# Patient Record
Sex: Female | Born: 1940
Health system: Southern US, Community
[De-identification: ages and names within clinical notes are randomized; demographics above are authoritative.]

## PROBLEM LIST (undated history)

## (undated) DIAGNOSIS — C539 Malignant neoplasm of cervix uteri, unspecified: Secondary | ICD-10-CM

## (undated) DIAGNOSIS — R112 Nausea with vomiting, unspecified: Secondary | ICD-10-CM

## (undated) DIAGNOSIS — I10 Essential (primary) hypertension: Secondary | ICD-10-CM

## (undated) DIAGNOSIS — M1990 Unspecified osteoarthritis, unspecified site: Secondary | ICD-10-CM

## (undated) DIAGNOSIS — K219 Gastro-esophageal reflux disease without esophagitis: Secondary | ICD-10-CM

## (undated) DIAGNOSIS — Z9889 Other specified postprocedural states: Secondary | ICD-10-CM

## (undated) HISTORY — PX: APPENDECTOMY: SHX54

## (undated) HISTORY — PX: CHOLECYSTECTOMY: SHX55

## (undated) HISTORY — PX: TONSILLECTOMY: SUR1361

## (undated) HISTORY — PX: ABDOMINAL HYSTERECTOMY: SHX81

## (undated) HISTORY — PX: CATARACT EXTRACTION, BILATERAL: SHX1313

---

## 2004-12-20 ENCOUNTER — Ambulatory Visit: Payer: Self-pay | Admitting: Family Medicine

## 2005-12-10 ENCOUNTER — Ambulatory Visit: Payer: Self-pay | Admitting: Family Medicine

## 2005-12-24 ENCOUNTER — Ambulatory Visit: Payer: Self-pay | Admitting: Family Medicine

## 2006-01-07 ENCOUNTER — Ambulatory Visit: Payer: Self-pay | Admitting: Family Medicine

## 2006-01-19 ENCOUNTER — Ambulatory Visit: Payer: Self-pay | Admitting: Family Medicine

## 2006-02-10 ENCOUNTER — Ambulatory Visit: Payer: Self-pay | Admitting: Family Medicine

## 2008-04-20 ENCOUNTER — Ambulatory Visit (HOSPITAL_COMMUNITY): Admission: RE | Admit: 2008-04-20 | Discharge: 2008-04-20 | Payer: Self-pay | Admitting: Ophthalmology

## 2008-06-08 ENCOUNTER — Ambulatory Visit (HOSPITAL_COMMUNITY): Admission: RE | Admit: 2008-06-08 | Discharge: 2008-06-08 | Payer: Self-pay | Admitting: Ophthalmology

## 2010-04-04 ENCOUNTER — Ambulatory Visit (HOSPITAL_COMMUNITY): Admission: RE | Admit: 2010-04-04 | Discharge: 2010-04-04 | Payer: Self-pay | Admitting: Gastroenterology

## 2011-02-25 ENCOUNTER — Other Ambulatory Visit: Payer: Self-pay | Admitting: Gastroenterology

## 2011-07-03 LAB — BASIC METABOLIC PANEL
BUN: 9
CO2: 30
Chloride: 107
Creatinine, Ser: 0.62
Glucose, Bld: 94

## 2012-10-29 ENCOUNTER — Other Ambulatory Visit: Payer: Self-pay | Admitting: Gastroenterology

## 2013-02-02 ENCOUNTER — Emergency Department (HOSPITAL_COMMUNITY)
Admission: EM | Admit: 2013-02-02 | Discharge: 2013-02-03 | Disposition: A | Discharge status: Home / Self Care | Payer: Medicare Other | Attending: Emergency Medicine | Admitting: Emergency Medicine

## 2013-02-02 ENCOUNTER — Encounter (HOSPITAL_COMMUNITY): Payer: Self-pay | Admitting: *Deleted

## 2013-02-02 DIAGNOSIS — Z87891 Personal history of nicotine dependence: Secondary | ICD-10-CM | POA: Insufficient documentation

## 2013-02-02 DIAGNOSIS — Z79899 Other long term (current) drug therapy: Secondary | ICD-10-CM | POA: Insufficient documentation

## 2013-02-02 DIAGNOSIS — Z8541 Personal history of malignant neoplasm of cervix uteri: Secondary | ICD-10-CM | POA: Insufficient documentation

## 2013-02-02 DIAGNOSIS — N390 Urinary tract infection, site not specified: Secondary | ICD-10-CM | POA: Insufficient documentation

## 2013-02-02 HISTORY — DX: Malignant neoplasm of cervix uteri, unspecified: C53.9

## 2013-02-02 NOTE — ED Notes (Signed)
Pt to the ED with c/o abdominal and left/right flank pain. Pt denies n/v/d. Pt was dx with a kidney infection from [facility]. Pt states pain has not improved since yesterday morning. Pt is A&Ox4, respirations equal and unlabored, skin warm and dry.

## 2013-02-03 ENCOUNTER — Emergency Department (HOSPITAL_COMMUNITY): Payer: Medicare Other

## 2013-02-03 LAB — URINALYSIS, MICROSCOPIC ONLY
Glucose, UA: NEGATIVE mg/dL
Hgb urine dipstick: NEGATIVE
Ketones, ur: 15 mg/dL — AB
Nitrite: POSITIVE — AB
Protein, ur: NEGATIVE mg/dL
Specific Gravity, Urine: 1.011 (ref 1.005–1.030)
Urobilinogen, UA: 1 mg/dL (ref 0.0–1.0)
pH: 5 (ref 5.0–8.0)

## 2013-02-03 LAB — COMPREHENSIVE METABOLIC PANEL
ALT: 15 U/L (ref 0–35)
AST: 22 U/L (ref 0–37)
Albumin: 3.8 g/dL (ref 3.5–5.2)
Alkaline Phosphatase: 101 U/L (ref 39–117)
BUN: 20 mg/dL (ref 6–23)
CO2: 30 mEq/L (ref 19–32)
Calcium: 9.4 mg/dL (ref 8.4–10.5)
Chloride: 100 mEq/L (ref 96–112)
Creatinine, Ser: 0.99 mg/dL (ref 0.50–1.10)
GFR calc Af Amer: 64 mL/min — ABNORMAL LOW (ref >90)
GFR calc non Af Amer: 56 mL/min — ABNORMAL LOW (ref >90)
Glucose, Bld: 124 mg/dL — ABNORMAL HIGH (ref 70–99)
Potassium: 3.7 mEq/L (ref 3.5–5.1)
Sodium: 139 mEq/L (ref 135–145)
Total Bilirubin: 0.5 mg/dL (ref 0.3–1.2)
Total Protein: 7.2 g/dL (ref 6.0–8.3)

## 2013-02-03 LAB — CBC WITH DIFFERENTIAL/PLATELET
Basophils Absolute: 0 10*3/uL (ref 0.0–0.1)
Basophils Relative: 0 % (ref 0–1)
Eosinophils Absolute: 0.4 10*3/uL (ref 0.0–0.7)
Eosinophils Relative: 2 % (ref 0–5)
HCT: 35.5 % — ABNORMAL LOW (ref 36.0–46.0)
Hemoglobin: 11.5 g/dL — ABNORMAL LOW (ref 12.0–15.0)
Lymphocytes Relative: 5 % — ABNORMAL LOW (ref 12–46)
Lymphs Abs: 0.9 10*3/uL (ref 0.7–4.0)
MCH: 27.9 pg (ref 26.0–34.0)
MCHC: 32.4 g/dL (ref 30.0–36.0)
MCV: 86.2 fL (ref 78.0–100.0)
Monocytes Absolute: 0.8 10*3/uL (ref 0.1–1.0)
Monocytes Relative: 4 % (ref 3–12)
Neutro Abs: 17.5 10*3/uL — ABNORMAL HIGH (ref 1.7–7.7)
Neutrophils Relative %: 89 % — ABNORMAL HIGH (ref 43–77)
Platelets: 185 10*3/uL (ref 150–400)
RBC: 4.12 MIL/uL (ref 3.87–5.11)
RDW: 14.1 % (ref 11.5–15.5)
WBC: 19.7 10*3/uL — ABNORMAL HIGH (ref 4.0–10.5)

## 2013-02-03 LAB — LIPASE, BLOOD: Lipase: 16 U/L (ref 11–59)

## 2013-02-03 MED ORDER — ONDANSETRON HCL 4 MG PO TABS: 4.0000 mg | ORAL_TABLET | Freq: Four times a day (QID) | ORAL | Status: DC

## 2013-02-03 MED ORDER — HYDROCODONE-ACETAMINOPHEN 5-325 MG PO TABS: 1.0000 | ORAL_TABLET | Freq: Four times a day (QID) | ORAL | Status: DC | PRN

## 2013-02-03 MED ORDER — HYDROCODONE-ACETAMINOPHEN 5-325 MG PO TABS: 1.0000 | ORAL_TABLET | Freq: Once | ORAL | Status: DC

## 2013-02-03 MED ORDER — CEPHALEXIN 250 MG PO CAPS: 250.0000 mg | ORAL_CAPSULE | Freq: Four times a day (QID) | ORAL | Status: DC

## 2013-02-03 MED ORDER — SODIUM CHLORIDE 0.9 % IV SOLN
Freq: Once | INTRAVENOUS | Status: AC
  Administered 2013-02-03: 02:00:00 via INTRAVENOUS

## 2013-02-03 MED ORDER — ONDANSETRON HCL 4 MG/2ML IJ SOLN
4.0000 mg | Freq: Once | INTRAMUSCULAR | Status: AC
  Administered 2013-02-03: 4 mg via INTRAVENOUS
  Filled 2013-02-03: qty 2

## 2013-02-03 MED ORDER — HYDROMORPHONE HCL PF 1 MG/ML IJ SOLN
1.0000 mg | Freq: Once | INTRAMUSCULAR | Status: AC
  Administered 2013-02-03: 1 mg via INTRAVENOUS
  Filled 2013-02-03: qty 1

## 2013-02-03 MED ORDER — DEXTROSE 5 % IV SOLN
1.0000 g | Freq: Once | INTRAVENOUS | Status: AC
  Administered 2013-02-03: 1 g via INTRAVENOUS
  Filled 2013-02-03: qty 10

## 2013-02-03 NOTE — ED Provider Notes (Signed)
History     CSN: [id number]  Arrival date & time 02/02/13  2301   First MD Initiated Contact with Patient 02/03/13 0115      Chief Complaint  Patient presents with  . Abdominal Pain  . Flank Pain    (Consider location/radiation/quality/duration/timing/severity/associated sxs/prior treatment) HPI Comments: Patient was seen yesterday in the [facility] for abdominal pain.  She had lab work, and CT scan, which revealed that she had a UTI.  She was discharged him with a rate in Macrobid, and one dose of Diflucan, which she, states she's been taking OCs, increased abdominal pain, which is diffuse and now radiating to her left flank, nausea, without vomiting.  She, states she's having regular bowel movements.  She has taken nothing for pain, or nausea.  She was prescribed, nothing.  Last night on discharge.  She has a history of abdominal hysterectomy and cholecystectomy both done.  A number of years ago.  Denies any other abdominal surgeries.  Denies any recent illnesses  Patient is a 72 y.o. female presenting with abdominal pain and flank pain. The history is provided by the patient.  Abdominal Pain Pain location:  Generalized Pain quality: aching and bloating   Pain radiates to:  L flank Pain severity:  Severe Onset quality:  Gradual Duration:  3 days Timing:  Constant Progression:  Worsening Chronicity:  New Relieved by:  Nothing Ineffective treatments:  None tried Associated symptoms: nausea   Associated symptoms: no chest pain, no chills, no constipation, no cough, no diarrhea, no dysuria, no fever, no shortness of breath, no vaginal bleeding, no vaginal discharge and no vomiting   Flank Pain Associated symptoms include abdominal pain and nausea. Pertinent negatives include no chest pain, chills, coughing, fever, headaches, myalgias, rash, vomiting or weakness.    Past Medical History  Diagnosis Date  . Cervical cancer     Past Surgical History  Procedure Laterality Date   . Abdominal hysterectomy    . Cholecystectomy    . Appendectomy      History reviewed. No pertinent family history.  History  Substance Use Topics  . Smoking status: Former Games developer  . Smokeless tobacco: Never Used  . Alcohol Use: No    OB History   Grav Para Term Preterm Abortions TAB SAB Ect Mult Living                  Review of Systems  Constitutional: Negative for fever and chills.  Respiratory: Negative for cough and shortness of breath.   Cardiovascular: Negative for chest pain and leg swelling.  Gastrointestinal: Positive for nausea and abdominal pain. Negative for vomiting, diarrhea, constipation and abdominal distention.  Genitourinary: Positive for frequency and flank pain. Negative for dysuria, vaginal bleeding, vaginal discharge and pelvic pain.  Musculoskeletal: Negative for myalgias.  Skin: Negative for rash.  Neurological: Negative for dizziness, weakness and headaches.  All other systems reviewed and are negative.    Allergies  Review of patient's allergies indicates no known allergies.  Home Medications   Current Outpatient Rx  Name  Route  Sig  Dispense  Refill  . esomeprazole (NEXIUM) 40 MG capsule   Oral   Take 40 mg by mouth daily before breakfast.         . fluconazole (DIFLUCAN) 150 MG tablet   Oral   Take 150 mg by mouth once.         Marland Kitchen ibuprofen (ADVIL,MOTRIN) 800 MG tablet   Oral   Take 800 mg by  mouth every morning.         . Multiple Vitamin (MULTIVITAMIN WITH MINERALS) TABS   Oral   Take 1 tablet by mouth daily.         . nitrofurantoin, macrocrystal-monohydrate, (MACROBID) 100 MG capsule   Oral   Take 100 mg by mouth 2 (two) times daily.         . phenazopyridine (PYRIDIUM) 200 MG tablet   Oral   Take 200 mg by mouth 3 (three) times daily as needed for pain.         . cephALEXin (KEFLEX) 250 MG capsule   Oral   Take 1 capsule (250 mg total) by mouth 4 (four) times daily.   28 capsule   0   .  HYDROcodone-acetaminophen (NORCO/VICODIN) 5-325 MG per tablet   Oral   Take 1 tablet by mouth every 6 (six) hours as needed for pain.   12 tablet   0   . ondansetron (ZOFRAN) 4 MG tablet   Oral   Take 1 tablet (4 mg total) by mouth every 6 (six) hours.   12 tablet   0     BP 145/54  Pulse 85  Temp(Src) 98.3 F (36.8 C) (Oral)  Resp 16  Ht 5\' 3"  (1.6 m)  Wt 162 lb 3.2 oz (73.573 kg)  BMI 28.74 kg/m2  SpO2 92%  Physical Exam  Nursing note and vitals reviewed. Constitutional: She is oriented to person, place, and time. She appears well-developed and well-nourished.  HENT:  Head: Normocephalic.  Eyes: Pupils are equal, round, and reactive to light.  Neck: Normal range of motion.  Cardiovascular: Normal rate and regular rhythm.   Pulmonary/Chest: Effort normal and breath sounds normal.  Abdominal: Soft. She exhibits no distension. Bowel sounds are decreased. There is no hepatosplenomegaly. There is generalized tenderness. There is no CVA tenderness.  Musculoskeletal: Normal range of motion.  Neurological: She is alert and oriented to person, place, and time.  Skin: Skin is warm and dry. There is pallor.    ED Course  Procedures (including critical care time)  Labs Reviewed  CBC WITH DIFFERENTIAL - Abnormal; Notable for the following:    WBC 19.7 (*)    Hemoglobin 11.5 (*)    HCT 35.5 (*)    Neutrophils Relative 89 (*)    Neutro Abs 17.5 (*)    Lymphocytes Relative 5 (*)    All other components within normal limits  COMPREHENSIVE METABOLIC PANEL - Abnormal; Notable for the following:    Glucose, Bld 124 (*)    GFR calc non Af Amer 56 (*)    GFR calc Af Amer 64 (*)    All other components within normal limits  URINALYSIS, MICROSCOPIC ONLY - Abnormal; Notable for the following:    Color, Urine ORANGE (*)    Bilirubin Urine SMALL (*)    Ketones, ur 15 (*)    Nitrite POSITIVE (*)    Leukocytes, UA MODERATE (*)    All other components within normal limits  LIPASE,  BLOOD  LACTIC ACID, PLASMA   Dg Abd Acute W/chest  02/03/2013  *RADIOLOGY REPORT*  Clinical Data: Upper abdominal pain and nausea for 48 hours.  ACUTE ABDOMEN SERIES (ABDOMEN 2 VIEW & CHEST 1 VIEW)  Comparison: CT abdomen and pelvis 02/01/2013, chest 03/08/2010  Findings: Cardiac enlargement with pulmonary vascular congestion and interstitial changes suggesting mild interstitial edema.  These changes have progressed since the previous study.  No focal consolidation.  No blunting of costophrenic  angles.  Underlying chronic bronchitic changes remain present.  Scattered gas and stool throughout the colon.  No small or large bowel distension.  No free intra-abdominal air.  No abnormal air fluid levels.  No radiopaque stones.  Postoperative changes in the pelvis.  Degenerative changes in the lumbar spine and hips.  IMPRESSION: Cardiac enlargement with pulmonary vascular congestion and interstitial edema.  Nonobstructive bowel gas pattern.   Original Report Authenticated By: Burman Nieves, M.D.      1. UTI (lower urinary tract infection)       MDM    will send for records from [facility] from [facility] reviewed.  White count.  At that time was 10.5.  CT scan showed that she had diverticula, without diverticulitis.  She does have a UTI today her white count is elevated at 19.7 Today.  We'll change her antibiotic from Macrobid to Keflex.  I will also give her hydrocodone for pain control, as well as Zofran for any nausea, or recommend she followup with her primary care physician in 10-14 days for test of cure       Arman Filter, NP 02/03/13 [phone]

## 2013-02-08 NOTE — ED Provider Notes (Signed)
Medical screening examination/treatment/procedure(s) were performed by non-physician practitioner and as supervising physician I was immediately available for consultation/collaboration.  Raeford Razor, MD 02/08/13 [phone]

## 2014-06-20 DIAGNOSIS — M19079 Primary osteoarthritis, unspecified ankle and foot: Secondary | ICD-10-CM | POA: Insufficient documentation

## 2014-09-13 DIAGNOSIS — E782 Mixed hyperlipidemia: Secondary | ICD-10-CM | POA: Insufficient documentation

## 2014-11-16 ENCOUNTER — Other Ambulatory Visit: Payer: Self-pay | Admitting: Orthopedic Surgery

## 2014-11-16 DIAGNOSIS — M1711 Unilateral primary osteoarthritis, right knee: Secondary | ICD-10-CM

## 2014-11-22 ENCOUNTER — Ambulatory Visit
Admission: RE | Admit: 2014-11-22 | Discharge: 2014-11-22 | Disposition: A | Discharge status: Home / Self Care | Payer: Medicare HMO | Source: Ambulatory Visit | Attending: Orthopedic Surgery | Admitting: Orthopedic Surgery

## 2014-11-22 DIAGNOSIS — M1711 Unilateral primary osteoarthritis, right knee: Secondary | ICD-10-CM

## 2015-01-15 ENCOUNTER — Other Ambulatory Visit (HOSPITAL_COMMUNITY): Payer: Self-pay | Admitting: Orthopedic Surgery

## 2015-01-15 DIAGNOSIS — Z4789 Encounter for other orthopedic aftercare: Secondary | ICD-10-CM

## 2015-01-16 ENCOUNTER — Ambulatory Visit (HOSPITAL_COMMUNITY)
Admission: RE | Admit: 2015-01-16 | Discharge: 2015-01-16 | Disposition: A | Discharge status: Home / Self Care | Payer: Medicare HMO | Source: Ambulatory Visit | Attending: Cardiovascular Disease | Admitting: Cardiovascular Disease

## 2015-01-16 DIAGNOSIS — M7989 Other specified soft tissue disorders: Secondary | ICD-10-CM

## 2015-01-16 DIAGNOSIS — M7121 Synovial cyst of popliteal space [Baker], right knee: Secondary | ICD-10-CM | POA: Diagnosis not present

## 2015-01-16 DIAGNOSIS — Z4789 Encounter for other orthopedic aftercare: Secondary | ICD-10-CM

## 2015-01-16 NOTE — Progress Notes (Signed)
Right lower extremity venous duplex completed. No evidence for DVT or SVT. Evidence for a Baker's cyst in the popliteal fossa measuring 2.7 x 1.3 x 4.4 centimeters. [place]

## 2015-01-22 ENCOUNTER — Telehealth (HOSPITAL_COMMUNITY): Payer: Self-pay | Admitting: *Deleted

## 2015-03-30 DIAGNOSIS — L03115 Cellulitis of right lower limb: Secondary | ICD-10-CM | POA: Insufficient documentation

## 2015-03-30 DIAGNOSIS — R600 Localized edema: Secondary | ICD-10-CM | POA: Insufficient documentation

## 2015-07-03 ENCOUNTER — Other Ambulatory Visit: Payer: Self-pay | Admitting: Surgical

## 2015-07-10 NOTE — Patient Instructions (Addendum)
[place]  07/10/2015   Your procedure is scheduled on:   07-25-2015 Wednesday  Enter through [facility] and follow signs to [facility]. Arrive at     0615   AM.  (Limit 1 person with you).  Call this number if you have problems the morning of surgery: [phone]  Or Presurgical Testing [phone].   For Living Will and/or Health Care Power Attorney Forms: please provide copy for your medical record,may bring AM of surgery(Forms should be already notarized -we do not provide this service).(07-11-15 No information preferred today).    Do not eat food/ or drink: After Midnight.      Take these medicines the morning of surgery with A SIP OF WATER-   (DO NOT TAKE ANY DIABETIC MEDS AM OF SURGERY) : Amlodipine. Nexium.   Do not wear jewelry, make-up or nail polish.  Do not wear deodorant, lotions, powders, or perfumes.   Do not shave legs and under arms- 48 hours(2 days) prior to first CHG shower.(Shaving face and neck okay.)  Do not bring valuables to the hospital.(Hospital is not responsible for lost valuables).  Contacts, dentures or removable bridgework, body piercing, hair pins may not be worn into surgery.  Leave suitcase in the car. After surgery it may be brought to your room.  For patients admitted to the hospital, checkout time is 11:00 AM the day of discharge.(Restricted visitors-Any Persons displaying flu-like symptoms or illness).    Patients discharged the day of surgery will not be allowed to drive home. Must have responsible person with you x 24 hours once discharged.  Name and phone number of your driver: spouse Ronalee Belts -[phone] home     Please read over the following fact sheets that you were given:  CHG(Chlorhexidine Gluconate 4% Surgical Soap) use, MRSA Information, Blood Transfusion fact sheet, Incentive Spirometry Instruction.  Remember : Type/Screen "Blue armbands" - may not be removed once applied(would result in being retested  AM of surgery, if removed).         [facility] - Preparing for Surgery Before surgery, you can play an important role.  Because skin is not sterile, your skin needs to be as free of germs as possible.  You can reduce the number of germs on your skin by washing with CHG (chlorahexidine gluconate) soap before surgery.  CHG is an antiseptic cleaner which kills germs and bonds with the skin to continue killing germs even after washing. Please DO NOT use if you have an allergy to CHG or antibacterial soaps.  If your skin becomes reddened/irritated stop using the CHG and inform your nurse when you arrive at Short Stay. Do not shave (including legs and underarms) for at least 48 hours prior to the first CHG shower.  You may shave your face/neck. Please follow these instructions carefully:  1.  Shower with CHG Soap the night before surgery and the  morning of Surgery.  2.  If you choose to wash your hair, wash your hair first as usual with your  normal  shampoo.  3.  After you shampoo, rinse your hair and body thoroughly to remove the  shampoo.                           4.  Use CHG as you would any other liquid soap.  You can apply chg directly  to the skin and wash  Gently with a scrungie or clean washcloth.  5.  Apply the CHG Soap to your body ONLY FROM THE NECK DOWN.   Do not use on face/ open                           Wound or open sores. Avoid contact with eyes, ears mouth and genitals (private parts).                       Wash face,  Genitals (private parts) with your normal soap.             6.  Wash thoroughly, paying special attention to the area where your surgery  will be performed.  7.  Thoroughly rinse your body with warm water from the neck down.  8.  DO NOT shower/wash with your normal soap after using and rinsing off  the CHG Soap.                9.  Pat yourself dry with a clean towel.            10.  Wear clean pajamas.            11.  Place clean sheets on your  bed the night of your first shower and do not  sleep with pets. Day of Surgery : Do not apply any lotions/deodorants the morning of surgery.  Please wear clean clothes to the hospital/surgery center.  FAILURE TO FOLLOW THESE INSTRUCTIONS MAY RESULT IN THE CANCELLATION OF YOUR SURGERY PATIENT SIGNATURE_________________________________  NURSE SIGNATURE__________________________________  ________________________________________________________________________   Adam Phenix  An incentive spirometer is a tool that can help keep your lungs clear and active. This tool measures how well you are filling your lungs with each breath. Taking long deep breaths may help reverse or decrease the chance of developing breathing (pulmonary) problems (especially infection) following:  A long period of time when you are unable to move or be active. BEFORE THE PROCEDURE   If the spirometer includes an indicator to show your best effort, your nurse or respiratory therapist will set it to a desired goal.  If possible, sit up straight or lean slightly forward. Try not to slouch.  Hold the incentive spirometer in an upright position. INSTRUCTIONS FOR USE   Sit on the edge of your bed if possible, or sit up as far as you can in bed or on a chair.  Hold the incentive spirometer in an upright position.  Breathe out normally.  Place the mouthpiece in your mouth and seal your lips tightly around it.  Breathe in slowly and as deeply as possible, raising the piston or the ball toward the top of the column.  Hold your breath for 3-5 seconds or for as long as possible. Allow the piston or ball to fall to the bottom of the column.  Remove the mouthpiece from your mouth and breathe out normally.  Rest for a few seconds and repeat Steps 1 through 7 at least 10 times every 1-2 hours when you are awake. Take your time and take a few normal breaths between deep breaths.  The spirometer may include an  indicator to show your best effort. Use the indicator as a goal to work toward during each repetition.  After each set of 10 deep breaths, practice coughing to be sure your lungs are clear. If you have an incision (the cut made at the time of  surgery), support your incision when coughing by placing a pillow or rolled up towels firmly against it. Once you are able to get out of bed, walk around indoors and cough well. You may stop using the incentive spirometer when instructed by your caregiver.  RISKS AND COMPLICATIONS  Take your time so you do not get dizzy or light-headed.  If you are in pain, you may need to take or ask for pain medication before doing incentive spirometry. It is harder to take a deep breath if you are having pain. AFTER USE  Rest and breathe slowly and easily.  It can be helpful to keep track of a log of your progress. Your caregiver can provide you with a simple table to help with this. If you are using the spirometer at home, follow these instructions: [place] IF:   You are having difficultly using the spirometer.  You have trouble using the spirometer as often as instructed.  Your pain medication is not giving enough relief while using the spirometer.  You develop fever of 100.5 F (38.1 C) or higher. SEEK IMMEDIATE MEDICAL CARE IF:   You cough up bloody sputum that had not been present before.  You develop fever of 102 F (38.9 C) or greater.  You develop worsening pain at or near the incision site. MAKE SURE YOU:   Understand these instructions.  Will watch your condition.  Will get help right away if you are not doing well or get worse. Document Released: 02/02/2007 Document Revised: 12/15/2011 Document Reviewed: 04/05/2007 Exit[employer] Patient Information 2014 [employer], [place].   ________________________________________________________________________  WHAT IS A BLOOD TRANSFUSION? Blood Transfusion Information  A transfusion is the  replacement of blood or some of its parts. Blood is made up of multiple cells which provide different functions.  Red blood cells carry oxygen and are used for blood loss replacement.  White blood cells fight against infection.  Platelets control bleeding.  Plasma helps clot blood.  Other blood products are available for specialized needs, such as hemophilia or other clotting disorders. BEFORE THE TRANSFUSION  Who gives blood for transfusions?   Healthy volunteers who are fully evaluated to make sure their blood is safe. This is blood bank blood. Transfusion therapy is the safest it has ever been in the practice of medicine. Before blood is taken from a donor, a complete history is taken to make sure that person has no history of diseases nor engages in risky social behavior (examples are intravenous drug use or sexual activity with multiple partners). The donor's travel history is screened to minimize risk of transmitting infections, such as malaria. The donated blood is tested for signs of infectious diseases, such as HIV and hepatitis. The blood is then tested to be sure it is compatible with you in order to minimize the chance of a transfusion reaction. If you or a relative donates blood, this is often done in anticipation of surgery and is not appropriate for emergency situations. It takes many days to process the donated blood. RISKS AND COMPLICATIONS Although transfusion therapy is very safe and saves many lives, the main dangers of transfusion include:   Getting an infectious disease.  Developing a transfusion reaction. This is an allergic reaction to something in the blood you were given. Every precaution is taken to prevent this. The decision to have a blood transfusion has been considered carefully by your caregiver before blood is given. Blood is not given unless the benefits outweigh the risks. AFTER THE TRANSFUSION  Right after receiving a blood transfusion, you will usually  feel much better and more energetic. This is especially true if your red blood cells have gotten low (anemic). The transfusion raises the level of the red blood cells which carry oxygen, and this usually causes an energy increase.  The nurse administering the transfusion will monitor you carefully for complications. HOME CARE INSTRUCTIONS  No special instructions are needed after a transfusion. You may find your energy is better. Speak with your caregiver about any limitations on activity for underlying diseases you may have. SEEK MEDICAL CARE IF:   Your condition is not improving after your transfusion.  You develop redness or irritation at the intravenous (IV) site. SEEK IMMEDIATE MEDICAL CARE IF:  Any of the following symptoms occur over the next 12 hours:  Shaking chills.  You have a temperature by mouth above 102 F (38.9 C), not controlled by medicine.  Chest, back, or muscle pain.  People around you feel you are not acting correctly or are confused.  Shortness of breath or difficulty breathing.  Dizziness and fainting.  You get a rash or develop hives.  You have a decrease in urine output.  Your urine turns a dark color or changes to pink, red, or brown. Any of the following symptoms occur over the next 10 days:  You have a temperature by mouth above 102 F (38.9 C), not controlled by medicine.  Shortness of breath.  Weakness after normal activity.  The white part of the eye turns yellow (jaundice).  You have a decrease in the amount of urine or are urinating less often.  Your urine turns a dark color or changes to pink, red, or brown. Document Released: 09/19/2000 Document Revised: 12/15/2011 Document Reviewed: 05/08/2008 [facility] Patient Information 2014 [place], [place].  _______________________________________________________________________

## 2015-07-11 ENCOUNTER — Ambulatory Visit (HOSPITAL_COMMUNITY)
Admission: RE | Admit: 2015-07-11 | Discharge: 2015-07-11 | Disposition: A | Discharge status: Home / Self Care | Payer: Medicare HMO | Source: Ambulatory Visit | Attending: Surgical | Admitting: Surgical

## 2015-07-11 ENCOUNTER — Encounter (HOSPITAL_COMMUNITY): Payer: Self-pay

## 2015-07-11 ENCOUNTER — Encounter (HOSPITAL_COMMUNITY)
Admission: RE | Admit: 2015-07-11 | Discharge: 2015-07-11 | Disposition: A | Discharge status: Home / Self Care | Payer: Medicare HMO | Source: Ambulatory Visit | Attending: Orthopedic Surgery | Admitting: Orthopedic Surgery

## 2015-07-11 DIAGNOSIS — Z87891 Personal history of nicotine dependence: Secondary | ICD-10-CM | POA: Insufficient documentation

## 2015-07-11 DIAGNOSIS — M179 Osteoarthritis of knee, unspecified: Secondary | ICD-10-CM | POA: Diagnosis not present

## 2015-07-11 DIAGNOSIS — Z01818 Encounter for other preprocedural examination: Secondary | ICD-10-CM

## 2015-07-11 DIAGNOSIS — Z01812 Encounter for preprocedural laboratory examination: Secondary | ICD-10-CM | POA: Diagnosis not present

## 2015-07-11 DIAGNOSIS — M5134 Other intervertebral disc degeneration, thoracic region: Secondary | ICD-10-CM | POA: Diagnosis not present

## 2015-07-11 HISTORY — DX: Gastro-esophageal reflux disease without esophagitis: K21.9

## 2015-07-11 HISTORY — DX: Nausea with vomiting, unspecified: R11.2

## 2015-07-11 HISTORY — DX: Essential (primary) hypertension: I10

## 2015-07-11 HISTORY — DX: Unspecified osteoarthritis, unspecified site: M19.90

## 2015-07-11 HISTORY — DX: Nausea with vomiting, unspecified: Z98.890

## 2015-07-11 LAB — CBC WITH DIFFERENTIAL/PLATELET
Basophils Absolute: 0 10*3/uL (ref 0.0–0.1)
Basophils Relative: 0 %
Eosinophils Absolute: 0.2 10*3/uL (ref 0.0–0.7)
Eosinophils Relative: 3 %
HCT: 35 % — ABNORMAL LOW (ref 36.0–46.0)
Hemoglobin: 11.3 g/dL — ABNORMAL LOW (ref 12.0–15.0)
Lymphocytes Relative: 23 %
Lymphs Abs: 1.7 10*3/uL (ref 0.7–4.0)
MCH: 28.1 pg (ref 26.0–34.0)
MCHC: 32.3 g/dL (ref 30.0–36.0)
MCV: 87.1 fL (ref 78.0–100.0)
Monocytes Absolute: 0.5 10*3/uL (ref 0.1–1.0)
Monocytes Relative: 7 %
Neutro Abs: 5 10*3/uL (ref 1.7–7.7)
Neutrophils Relative %: 67 %
Platelets: 227 10*3/uL (ref 150–400)
RBC: 4.02 MIL/uL (ref 3.87–5.11)
RDW: 13.9 % (ref 11.5–15.5)
WBC: 7.3 10*3/uL (ref 4.0–10.5)

## 2015-07-11 LAB — COMPREHENSIVE METABOLIC PANEL
ALT: 20 U/L (ref 14–54)
AST: 27 U/L (ref 15–41)
Albumin: 4.2 g/dL (ref 3.5–5.0)
Alkaline Phosphatase: 95 U/L (ref 38–126)
Anion gap: 7 (ref 5–15)
BUN: 19 mg/dL (ref 6–20)
CO2: 28 mmol/L (ref 22–32)
Calcium: 9.4 mg/dL (ref 8.9–10.3)
Chloride: 106 mmol/L (ref 101–111)
Creatinine, Ser: 0.69 mg/dL (ref 0.44–1.00)
GFR calc Af Amer: >60 mL/min (ref >60)
GFR calc non Af Amer: >60 mL/min (ref >60)
Glucose, Bld: 100 mg/dL — ABNORMAL HIGH (ref 65–99)
Potassium: 3.9 mmol/L (ref 3.5–5.1)
Sodium: 141 mmol/L (ref 135–145)
Total Bilirubin: 0.2 mg/dL — ABNORMAL LOW (ref 0.3–1.2)
Total Protein: 7.5 g/dL (ref 6.5–8.1)

## 2015-07-11 LAB — URINALYSIS, ROUTINE W REFLEX MICROSCOPIC
Bilirubin Urine: NEGATIVE
Glucose, UA: NEGATIVE mg/dL
Hgb urine dipstick: NEGATIVE
Ketones, ur: NEGATIVE mg/dL
Leukocytes, UA: NEGATIVE
Nitrite: NEGATIVE
Protein, ur: NEGATIVE mg/dL
Specific Gravity, Urine: 1.008 (ref 1.005–1.030)
Urobilinogen, UA: 0.2 mg/dL (ref 0.0–1.0)
pH: 6 (ref 5.0–8.0)

## 2015-07-11 LAB — PROTIME-INR
INR: 1.05 (ref 0.00–1.49)
Prothrombin Time: 13.9 seconds (ref 11.6–15.2)

## 2015-07-11 LAB — SURGICAL PCR SCREEN
MRSA, PCR: NEGATIVE
Staphylococcus aureus: POSITIVE — AB

## 2015-07-11 IMAGING — CR DG CHEST 2V
2 series · 2 of 2 positions shown · non-contrast
Comparison: None.

CLINICAL DATA: Preoperative examination prior to knee arthroplasty,
no cardiopulmonary history, discontinued smoking five years ago.

EXAM:
CHEST  2 VIEW

[w chest pa]
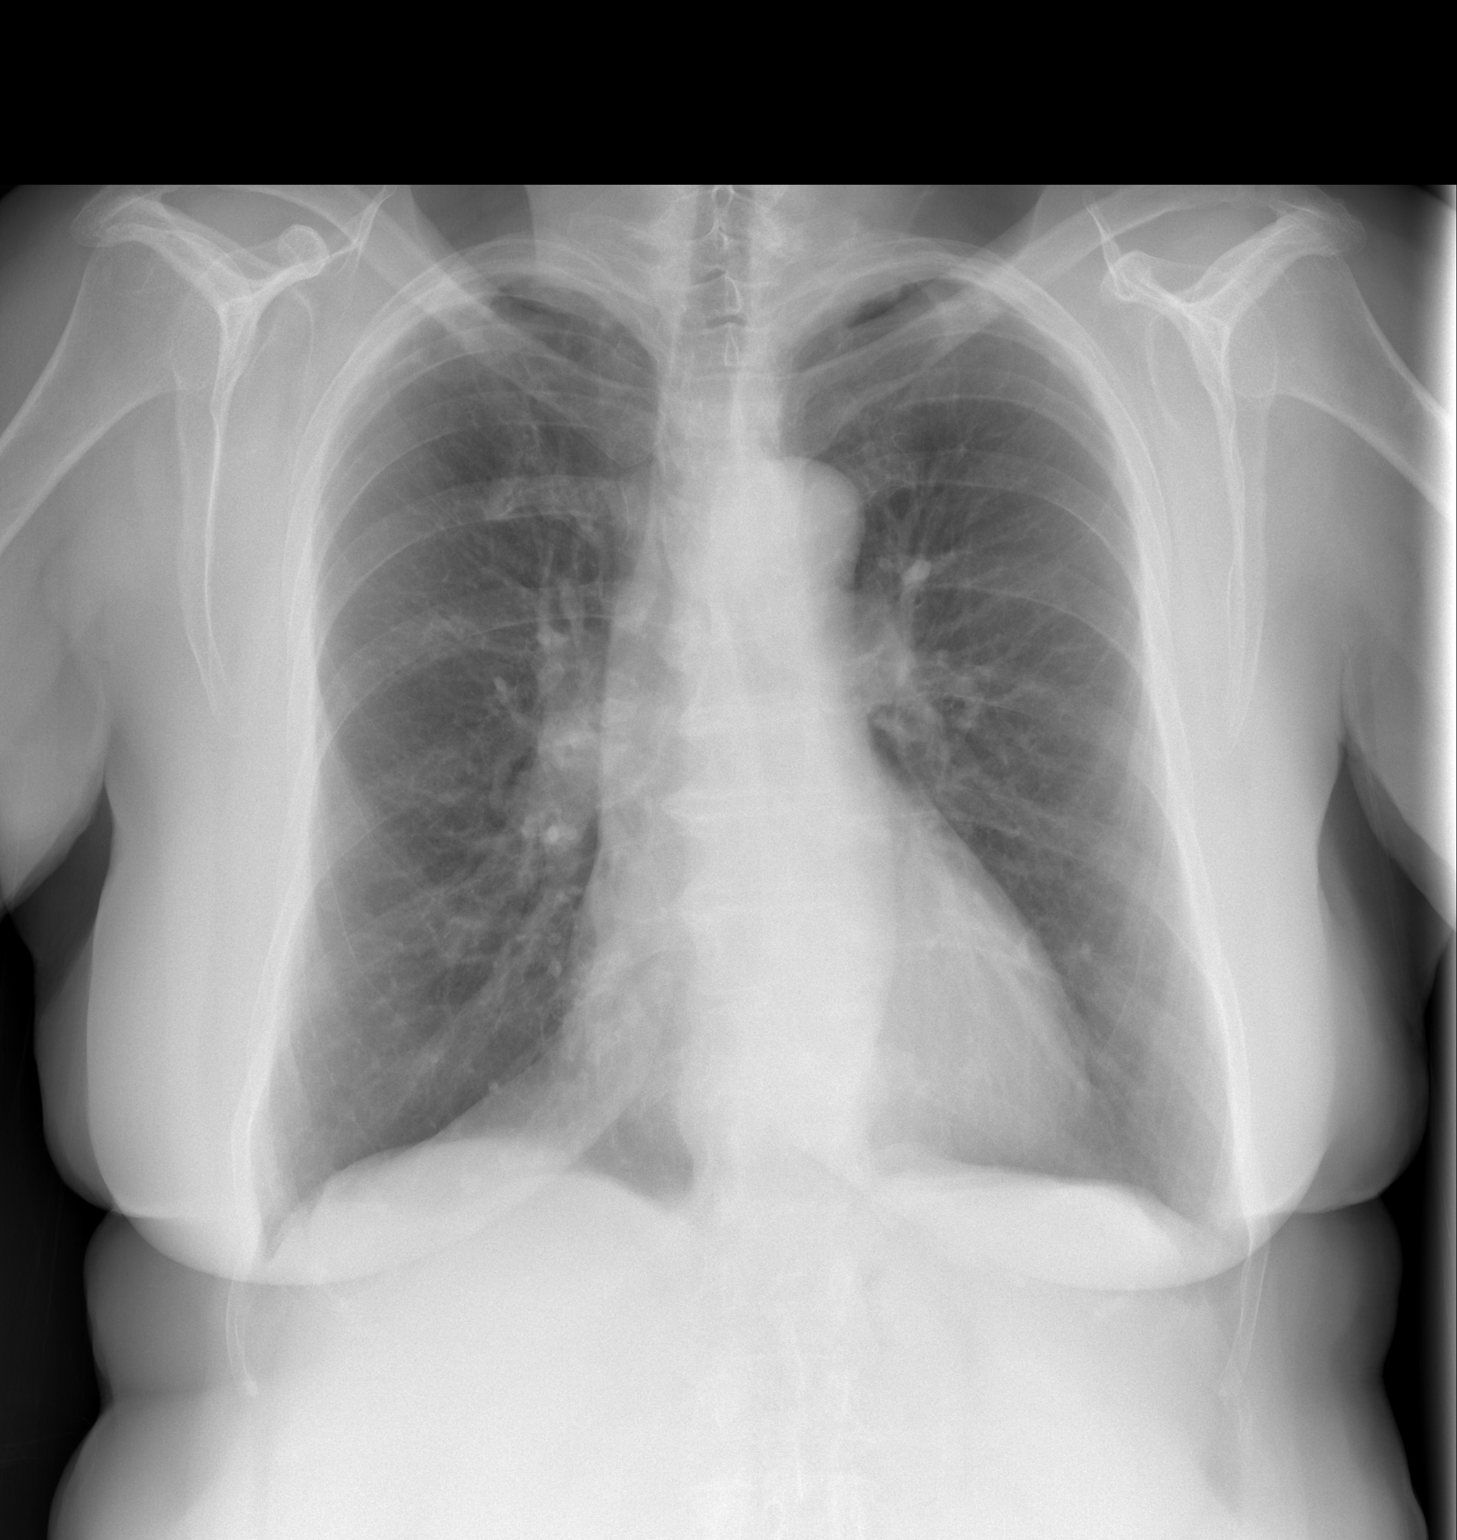

[w chest lat]
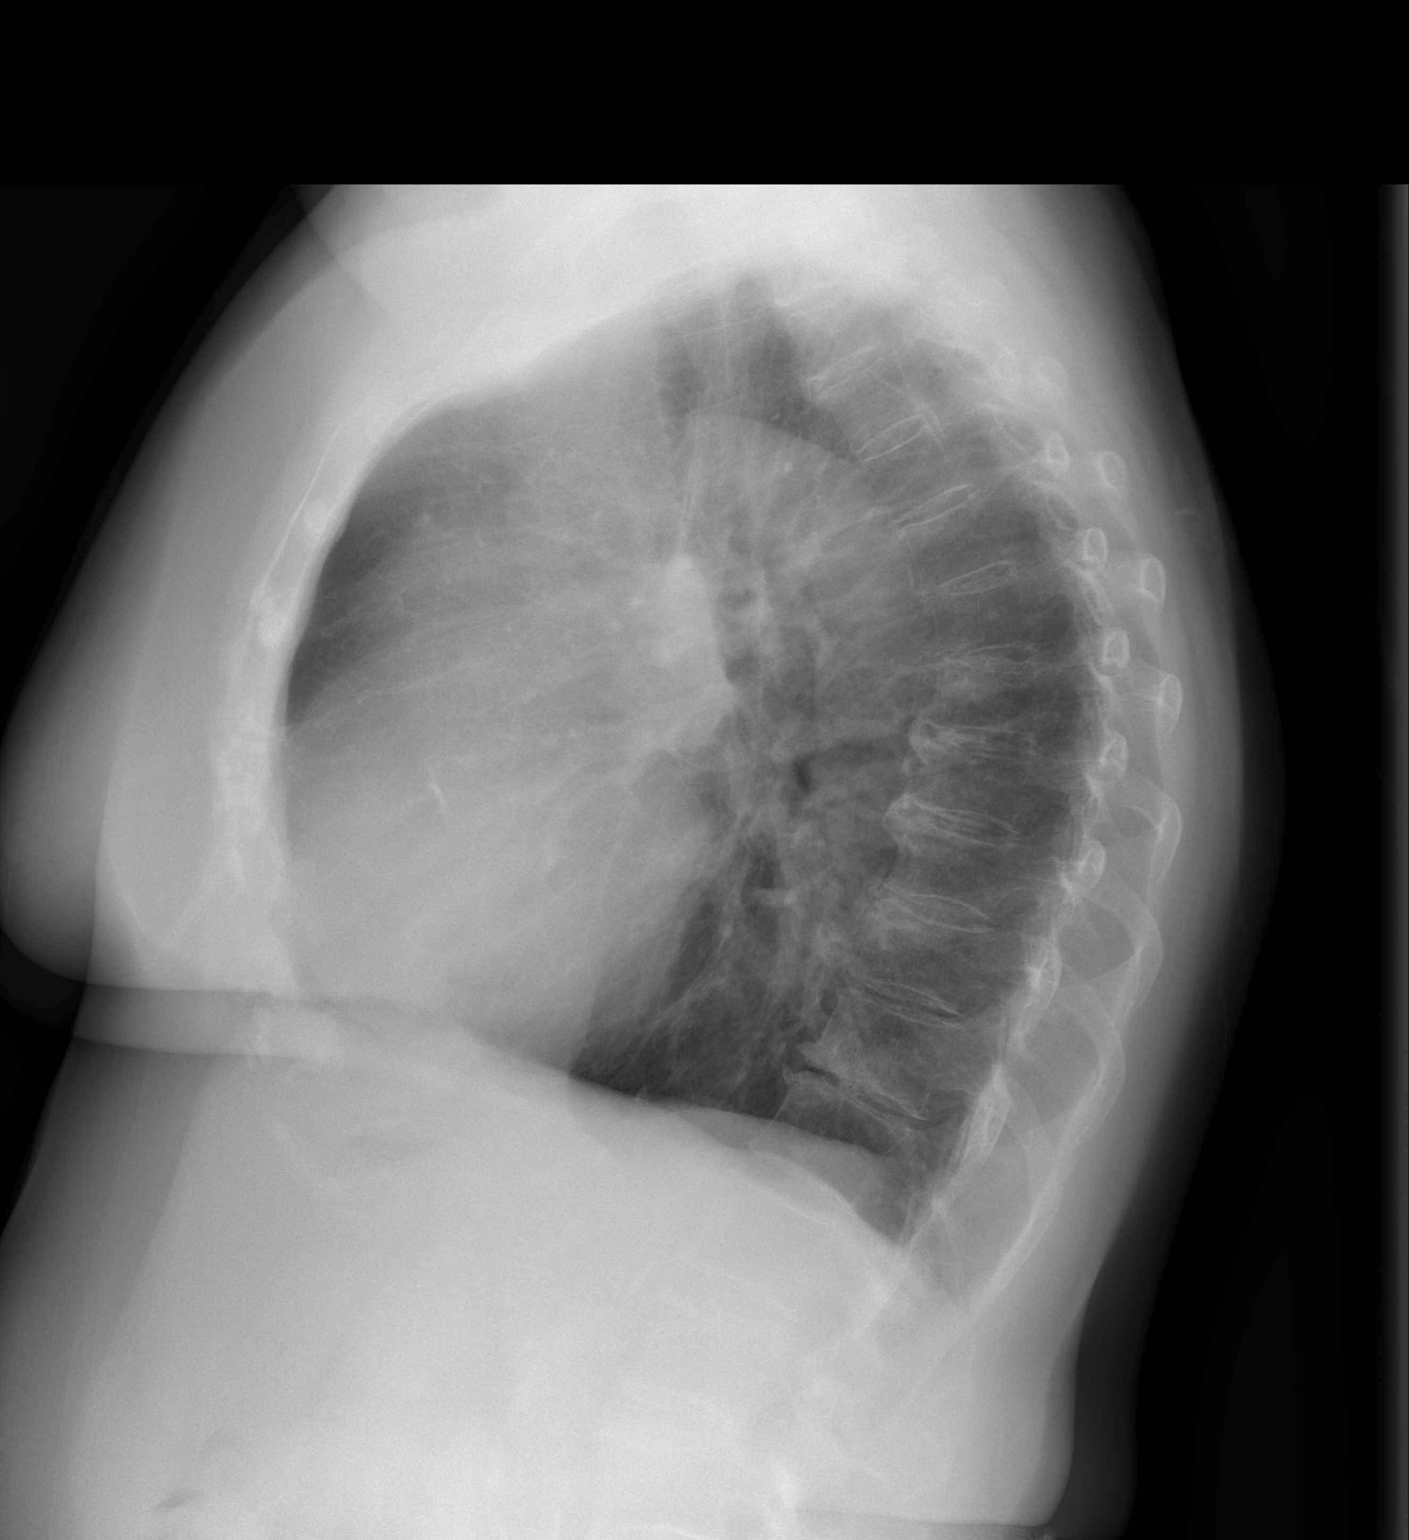

[2 of 2 positions shown; findings below may reference images not displayed]

FINDINGS: The lungs are mildly hyperinflated with hemidiaphragm flattening and
increased AP dimension of the thorax. There is no alveolar
infiltrate. There is no pleural effusion or pneumothorax. The heart
and pulmonary vascularity are normal. The mediastinum is normal in
width. There is multilevel degenerative disc disease of the thoracic
spine.
IMPRESSION: Hyperinflation which may reflect COPD. There is no active
cardiopulmonary disease.

## 2015-07-11 NOTE — Progress Notes (Signed)
07-11-15 1630 Positive PCR Staph aureus. Pt. Aware with voice message to pick up RX from [facility] [phone] and use as directed. Message sent per Epic Fax [phone].

## 2015-07-11 NOTE — Pre-Procedure Instructions (Addendum)
EKG/ CXR done per MD order today. 07-11-15 1630 Positive Staph aureus by PCR- pt left voice mail to use Mupirocin as directed, fax note sent to Dr. Charlestine Night office.

## 2015-07-24 NOTE — H&P (Signed)
TOTAL KNEE ADMISSION H&P  Patient is being admitted for right total knee arthroplasty.  Subjective:  Chief Complaint:right knee pain.  HPI: Frances Morrison, 74 y.o. female, has a history of pain and functional disability in the right knee due to arthritis and has failed non-surgical conservative treatments for greater than 12 weeks to includeNSAID's and/or analgesics, corticosteriod injections and activity modification.  Onset of symptoms was gradual, starting 2 years ago with gradually worsening course since that time. The patient noted prior procedures on the knee to include  arthroscopy and menisectomy on the right knee(s).  Patient currently rates pain in the right knee(s) at 7 out of 10 with activity. Patient has night pain, worsening of pain with activity and weight bearing, pain that interferes with activities of daily living, pain with passive range of motion, crepitus and joint swelling.  Patient has evidence of periarticular osteophytes and joint space narrowing by imaging studies. There is no active infection.   Past Medical History  Diagnosis Date  . Cervical cancer ([place])   . Hypertension   . PONV (postoperative nausea and vomiting)   . GERD (gastroesophageal reflux disease)   . Arthritis     arthritis -feet, hands knee    Past Surgical History  Procedure Laterality Date  . Abdominal hysterectomy    . Cholecystectomy    . Appendectomy    . Cataract extraction, bilateral Bilateral   . Tonsillectomy       Current outpatient prescriptions:  .  acetaminophen (TYLENOL) 500 MG tablet, Take 1,000 mg by mouth every 6 (six) hours as needed for headache., Disp: , Rfl:  .  amLODipine (NORVASC) 10 MG tablet, Take 10 mg by mouth every morning., Disp: , Rfl:  .  esomeprazole (NEXIUM) 40 MG capsule, Take 40 mg by mouth daily before breakfast., Disp: , Rfl:  .  Multiple Vitamin (MULTIVITAMIN WITH MINERALS) TABS, Take 1 tablet by mouth daily., Disp: , Rfl:  .  naproxen sodium (ANAPROX) 220  MG tablet, Take 220 mg by mouth 2 (two) times daily with a meal., Disp: , Rfl:   Allergies: Betadine  Social History  Substance Use Topics  . Smoking status: Former Smoker -- 1.00 packs/day for 12 years    Types: Cigarettes    Quit date: 07/10/2010  . Smokeless tobacco: Never Used  . Alcohol Use: No      Review of Systems  Constitutional: Negative.   HENT: Negative.   Eyes: Negative.   Respiratory: Negative.   Cardiovascular: Negative.   Gastrointestinal: Negative.   Genitourinary: Positive for frequency. Negative for dysuria, urgency, hematuria and flank pain.  Musculoskeletal: Positive for back pain and joint pain. Negative for myalgias, falls and neck pain.  Skin: Negative.   Neurological: Negative.   Endo/Heme/Allergies: Negative.   Psychiatric/Behavioral: Negative.     Objective:  Physical Exam  Constitutional: She is oriented to person, place, and time. She appears well-developed. No distress.  Overweight  HENT:  Head: Normocephalic and atraumatic.  Right Ear: External ear normal.  Left Ear: External ear normal.  Nose: Nose normal.  Mouth/Throat: Oropharynx is clear and moist.  Eyes: Conjunctivae and EOM are normal.  Neck: Normal range of motion. Neck supple.  Cardiovascular: Normal rate, regular rhythm, normal heart sounds and intact distal pulses.   No murmur heard. Respiratory: Effort normal and breath sounds normal. No respiratory distress. She has no wheezes.  GI: Soft. Bowel sounds are normal. She exhibits no distension. There is no tenderness.  Musculoskeletal:  Right hip: Normal.       Left hip: Normal.       Right knee: She exhibits decreased range of motion and swelling. She exhibits no effusion and no erythema. Tenderness found. Medial joint line and lateral joint line tenderness noted.       Left knee: Normal.  Neurological: She is alert and oriented to person, place, and time. She has normal strength and normal reflexes. No sensory deficit.   Skin: No rash noted. She is not diaphoretic. No erythema.  Psychiatric: She has a normal mood and affect. Her behavior is normal.    Vitals  Weight: 173 lb Height: 63in Body Surface Area: 1.82 m Body Mass Index: 30.65 kg/m  Pulse: 80 (Regular)  BP: 160/74 (Sitting, Left Arm, Standard)  Imaging Review Plain radiographs demonstrate severe degenerative joint disease of the right knee(s). The overall alignment ismild varus. The bone quality appears to be good for age and reported activity level.  Assessment/Plan:  End stage primary osteoarthritis, right knee   The patient history, physical examination, clinical judgment of the provider and imaging studies are consistent with end stage degenerative joint disease of the right knee(s) and total knee arthroplasty is deemed medically necessary. The treatment options including medical management, injection therapy arthroscopy and arthroplasty were discussed at length. The risks and benefits of total knee arthroplasty were presented and reviewed. The risks due to aseptic loosening, infection, stiffness, patella tracking problems, thromboembolic complications and other imponderables were discussed. The patient acknowledged the explanation, agreed to proceed with the plan and consent was signed. Patient is being admitted for inpatient treatment for surgery, pain control, PT, OT, prophylactic antibiotics, VTE prophylaxis, progressive ambulation and ADL's and discharge planning. The patient is planning to be discharged home with home health services    TXA IV PCP: Dr. Bridgett Larsson, PA-C

## 2015-07-25 ENCOUNTER — Encounter (HOSPITAL_COMMUNITY): Payer: Self-pay | Admitting: *Deleted

## 2015-07-25 ENCOUNTER — Inpatient Hospital Stay (HOSPITAL_COMMUNITY): Payer: Medicare HMO | Admitting: Anesthesiology

## 2015-07-25 ENCOUNTER — Inpatient Hospital Stay (HOSPITAL_COMMUNITY)
Admission: RE | Admit: 2015-07-25 | Discharge: 2015-07-27 | DRG: 470 | Disposition: A | Discharge status: Home Health | Payer: Medicare HMO | Source: Ambulatory Visit | Attending: Orthopedic Surgery | Admitting: Orthopedic Surgery

## 2015-07-25 ENCOUNTER — Encounter (HOSPITAL_COMMUNITY)
Admission: RE | Disposition: A | Discharge status: Home Health | Payer: Self-pay | Source: Ambulatory Visit | Attending: Orthopedic Surgery

## 2015-07-25 DIAGNOSIS — Z8541 Personal history of malignant neoplasm of cervix uteri: Secondary | ICD-10-CM | POA: Diagnosis not present

## 2015-07-25 DIAGNOSIS — I10 Essential (primary) hypertension: Secondary | ICD-10-CM | POA: Diagnosis present

## 2015-07-25 DIAGNOSIS — E663 Overweight: Secondary | ICD-10-CM | POA: Diagnosis present

## 2015-07-25 DIAGNOSIS — R21 Rash and other nonspecific skin eruption: Secondary | ICD-10-CM | POA: Diagnosis not present

## 2015-07-25 DIAGNOSIS — Z9049 Acquired absence of other specified parts of digestive tract: Secondary | ICD-10-CM | POA: Diagnosis not present

## 2015-07-25 DIAGNOSIS — M25561 Pain in right knee: Secondary | ICD-10-CM | POA: Diagnosis present

## 2015-07-25 DIAGNOSIS — Z79899 Other long term (current) drug therapy: Secondary | ICD-10-CM | POA: Diagnosis not present

## 2015-07-25 DIAGNOSIS — K219 Gastro-esophageal reflux disease without esophagitis: Secondary | ICD-10-CM | POA: Diagnosis present

## 2015-07-25 DIAGNOSIS — Z6830 Body mass index (BMI) 30.0-30.9, adult: Secondary | ICD-10-CM

## 2015-07-25 DIAGNOSIS — M1711 Unilateral primary osteoarthritis, right knee: Secondary | ICD-10-CM | POA: Diagnosis present

## 2015-07-25 DIAGNOSIS — Z87891 Personal history of nicotine dependence: Secondary | ICD-10-CM | POA: Diagnosis not present

## 2015-07-25 DIAGNOSIS — Z96659 Presence of unspecified artificial knee joint: Secondary | ICD-10-CM

## 2015-07-25 HISTORY — PX: TOTAL KNEE ARTHROPLASTY: SHX125

## 2015-07-25 LAB — ABO/RH: ABO/RH(D): A POS

## 2015-07-25 LAB — TYPE AND SCREEN
ABO/RH(D): A POS
Antibody Screen: NEGATIVE

## 2015-07-25 SURGERY — ARTHROPLASTY, KNEE, TOTAL
Anesthesia: General | Site: Knee | Laterality: Right

## 2015-07-25 MED ORDER — ACETAMINOPHEN 10 MG/ML IV SOLN
1000.0000 mg | Freq: Once | INTRAVENOUS | Status: AC
  Administered 2015-07-25: 1000 mg via INTRAVENOUS

## 2015-07-25 MED ORDER — CEFAZOLIN SODIUM-DEXTROSE 2-3 GM-% IV SOLR
2.0000 g | INTRAVENOUS | Status: AC
  Administered 2015-07-25: 2 g via INTRAVENOUS

## 2015-07-25 MED ORDER — BUPIVACAINE LIPOSOME 1.3 % IJ SUSP
20.0000 mL | Freq: Once | INTRAMUSCULAR | Status: DC
  Filled 2015-07-25: qty 20

## 2015-07-25 MED ORDER — FERROUS SULFATE 325 (65 FE) MG PO TABS
325.0000 mg | ORAL_TABLET | Freq: Three times a day (TID) | ORAL | Status: DC
  Administered 2015-07-26 – 2015-07-27 (×3): 325 mg via ORAL
  Filled 2015-07-25 (×8): qty 1

## 2015-07-25 MED ORDER — BUPIVACAINE-EPINEPHRINE (PF) 0.5% -1:200000 IJ SOLN
INTRAMUSCULAR | Status: AC
  Filled 2015-07-25: qty 30

## 2015-07-25 MED ORDER — DEXTROSE 5 % IV SOLN
500.0000 mg | Freq: Four times a day (QID) | INTRAVENOUS | Status: DC | PRN
  Administered 2015-07-25: 500 mg via INTRAVENOUS
  Filled 2015-07-25 (×2): qty 5

## 2015-07-25 MED ORDER — HYDROMORPHONE HCL 1 MG/ML IJ SOLN
0.2500 mg | INTRAMUSCULAR | Status: DC | PRN
  Administered 2015-07-25: 0.5 mg via INTRAVENOUS

## 2015-07-25 MED ORDER — ROCURONIUM BROMIDE 100 MG/10ML IV SOLN
INTRAVENOUS | Status: AC
Start: 2015-07-25 — End: 2015-07-25
  Filled 2015-07-25: qty 1

## 2015-07-25 MED ORDER — PROPOFOL 10 MG/ML IV BOLUS
INTRAVENOUS | Status: DC | PRN
  Administered 2015-07-25: 140 mg via INTRAVENOUS

## 2015-07-25 MED ORDER — CELECOXIB 200 MG PO CAPS
200.0000 mg | ORAL_CAPSULE | Freq: Two times a day (BID) | ORAL | Status: DC
  Administered 2015-07-25 – 2015-07-27 (×4): 200 mg via ORAL
  Filled 2015-07-25 (×5): qty 1

## 2015-07-25 MED ORDER — ALUM & MAG HYDROXIDE-SIMETH 200-200-20 MG/5ML PO SUSP: 30.0000 mL | ORAL | Status: DC | PRN

## 2015-07-25 MED ORDER — ONDANSETRON HCL 4 MG/2ML IJ SOLN
INTRAMUSCULAR | Status: DC | PRN
Start: 2015-07-25 — End: 2015-07-25
  Administered 2015-07-25: 4 mg via INTRAVENOUS

## 2015-07-25 MED ORDER — MIDAZOLAM HCL 2 MG/2ML IJ SOLN
INTRAMUSCULAR | Status: AC
  Filled 2015-07-25: qty 4

## 2015-07-25 MED ORDER — SCOPOLAMINE 1 MG/3DAYS TD PT72
MEDICATED_PATCH | TRANSDERMAL | Status: AC
  Filled 2015-07-25: qty 1

## 2015-07-25 MED ORDER — OXYCODONE HCL 5 MG/5ML PO SOLN: 5.0000 mg | Freq: Once | ORAL | Status: DC | PRN

## 2015-07-25 MED ORDER — HYDROMORPHONE HCL 1 MG/ML IJ SOLN
1.0000 mg | INTRAMUSCULAR | Status: DC | PRN
  Administered 2015-07-25: 1 mg via INTRAVENOUS
  Filled 2015-07-25: qty 1

## 2015-07-25 MED ORDER — BISACODYL 5 MG PO TBEC: 5.0000 mg | DELAYED_RELEASE_TABLET | Freq: Every day | ORAL | Status: DC | PRN

## 2015-07-25 MED ORDER — PROMETHAZINE HCL 25 MG/ML IJ SOLN
INTRAMUSCULAR | Status: AC
  Filled 2015-07-25: qty 1

## 2015-07-25 MED ORDER — TRANEXAMIC ACID 1000 MG/10ML IV SOLN
1000.0000 mg | INTRAVENOUS | Status: AC
  Administered 2015-07-25: 1000 mg via INTRAVENOUS
  Filled 2015-07-25: qty 10

## 2015-07-25 MED ORDER — HYDROMORPHONE HCL 1 MG/ML IJ SOLN
INTRAMUSCULAR | Status: AC
  Filled 2015-07-25: qty 1

## 2015-07-25 MED ORDER — AMLODIPINE BESYLATE 10 MG PO TABS
10.0000 mg | ORAL_TABLET | Freq: Every morning | ORAL | Status: DC
  Administered 2015-07-26 – 2015-07-27 (×2): 10 mg via ORAL
  Filled 2015-07-25 (×2): qty 1

## 2015-07-25 MED ORDER — METOCLOPRAMIDE HCL 5 MG/ML IJ SOLN
INTRAMUSCULAR | Status: DC | PRN
  Administered 2015-07-25: 10 mg via INTRAVENOUS

## 2015-07-25 MED ORDER — LACTATED RINGERS IV SOLN
INTRAVENOUS | Status: DC
  Administered 2015-07-25 (×2): via INTRAVENOUS

## 2015-07-25 MED ORDER — BUPIVACAINE HCL (PF) 0.25 % IJ SOLN
INTRAMUSCULAR | Status: AC
  Filled 2015-07-25: qty 30

## 2015-07-25 MED ORDER — KETOROLAC TROMETHAMINE 30 MG/ML IJ SOLN
INTRAMUSCULAR | Status: DC | PRN
  Administered 2015-07-25: 30 mg via INTRAVENOUS

## 2015-07-25 MED ORDER — ONDANSETRON HCL 4 MG PO TABS: 4.0000 mg | ORAL_TABLET | Freq: Four times a day (QID) | ORAL | Status: DC | PRN

## 2015-07-25 MED ORDER — DEXAMETHASONE SODIUM PHOSPHATE 10 MG/ML IJ SOLN
INTRAMUSCULAR | Status: DC | PRN
  Administered 2015-07-25: 10 mg via INTRAVENOUS

## 2015-07-25 MED ORDER — ONDANSETRON HCL 4 MG/2ML IJ SOLN: 4.0000 mg | Freq: Four times a day (QID) | INTRAMUSCULAR | Status: DC | PRN

## 2015-07-25 MED ORDER — SCOPOLAMINE 1 MG/3DAYS TD PT72
MEDICATED_PATCH | TRANSDERMAL | Status: DC | PRN
  Administered 2015-07-25: 1 via TRANSDERMAL

## 2015-07-25 MED ORDER — DEXAMETHASONE SODIUM PHOSPHATE 10 MG/ML IJ SOLN
INTRAMUSCULAR | Status: AC
  Filled 2015-07-25: qty 1

## 2015-07-25 MED ORDER — PROPOFOL 10 MG/ML IV BOLUS
INTRAVENOUS | Status: AC
  Filled 2015-07-25: qty 20

## 2015-07-25 MED ORDER — CEFAZOLIN SODIUM-DEXTROSE 2-3 GM-% IV SOLR
INTRAVENOUS | Status: AC
  Filled 2015-07-25: qty 50

## 2015-07-25 MED ORDER — SODIUM CHLORIDE 0.9 % IR SOLN
Status: DC | PRN
  Administered 2015-07-25: 500 mL

## 2015-07-25 MED ORDER — FENTANYL CITRATE (PF) 100 MCG/2ML IJ SOLN
INTRAMUSCULAR | Status: DC | PRN
  Administered 2015-07-25 (×5): 50 ug via INTRAVENOUS

## 2015-07-25 MED ORDER — ROCURONIUM BROMIDE 100 MG/10ML IV SOLN
INTRAVENOUS | Status: DC | PRN
  Administered 2015-07-25: 35 mg via INTRAVENOUS
  Administered 2015-07-25: 5 mg via INTRAVENOUS

## 2015-07-25 MED ORDER — THROMBIN 5000 UNITS EX SOLR
OROMUCOSAL | Status: DC | PRN
  Administered 2015-07-25: 10:00:00 via TOPICAL

## 2015-07-25 MED ORDER — ACETAMINOPHEN 650 MG RE SUPP: 650.0000 mg | Freq: Four times a day (QID) | RECTAL | Status: DC | PRN

## 2015-07-25 MED ORDER — SODIUM CHLORIDE 0.9 % IJ SOLN
INTRAMUSCULAR | Status: DC | PRN
  Administered 2015-07-25: 20 mL

## 2015-07-25 MED ORDER — POLYETHYLENE GLYCOL 3350 17 G PO PACK: 17.0000 g | PACK | Freq: Every day | ORAL | Status: DC | PRN

## 2015-07-25 MED ORDER — EPHEDRINE SULFATE 50 MG/ML IJ SOLN
INTRAMUSCULAR | Status: AC
  Filled 2015-07-25: qty 1

## 2015-07-25 MED ORDER — HYDROCODONE-ACETAMINOPHEN 5-325 MG PO TABS
1.0000 | ORAL_TABLET | ORAL | Status: DC | PRN
  Administered 2015-07-25 – 2015-07-26 (×2): 1 via ORAL
  Administered 2015-07-26 – 2015-07-27 (×5): 2 via ORAL
  Filled 2015-07-25 (×5): qty 2
  Filled 2015-07-25 (×2): qty 1

## 2015-07-25 MED ORDER — OXYCODONE HCL 5 MG PO TABS: 5.0000 mg | ORAL_TABLET | Freq: Once | ORAL | Status: DC | PRN

## 2015-07-25 MED ORDER — BUPIVACAINE HCL (PF) 0.25 % IJ SOLN
INTRAMUSCULAR | Status: DC | PRN
  Administered 2015-07-25: 30 mL

## 2015-07-25 MED ORDER — PANTOPRAZOLE SODIUM 40 MG PO TBEC
40.0000 mg | DELAYED_RELEASE_TABLET | Freq: Every day | ORAL | Status: DC
  Administered 2015-07-26 – 2015-07-27 (×2): 40 mg via ORAL
  Filled 2015-07-25 (×3): qty 1

## 2015-07-25 MED ORDER — PHENYLEPHRINE 40 MCG/ML (10ML) SYRINGE FOR IV PUSH (FOR BLOOD PRESSURE SUPPORT)
PREFILLED_SYRINGE | INTRAVENOUS | Status: AC
  Filled 2015-07-25: qty 10

## 2015-07-25 MED ORDER — SODIUM CHLORIDE 0.9 % IJ SOLN
INTRAMUSCULAR | Status: AC
  Filled 2015-07-25: qty 10

## 2015-07-25 MED ORDER — SODIUM CHLORIDE 0.9 % IJ SOLN
INTRAMUSCULAR | Status: AC
  Filled 2015-07-25: qty 50

## 2015-07-25 MED ORDER — PHENOL 1.4 % MT LIQD: 1.0000 | OROMUCOSAL | Status: DC | PRN

## 2015-07-25 MED ORDER — ACETAMINOPHEN 325 MG PO TABS: 650.0000 mg | ORAL_TABLET | Freq: Four times a day (QID) | ORAL | Status: DC | PRN

## 2015-07-25 MED ORDER — HYDROMORPHONE HCL 1 MG/ML IJ SOLN
0.2500 mg | INTRAMUSCULAR | Status: DC | PRN
  Administered 2015-07-25 (×4): 0.5 mg via INTRAVENOUS

## 2015-07-25 MED ORDER — ROCURONIUM BROMIDE 100 MG/10ML IV SOLN
INTRAVENOUS | Status: AC
  Filled 2015-07-25: qty 1

## 2015-07-25 MED ORDER — PROMETHAZINE HCL 25 MG/ML IJ SOLN
6.2500 mg | INTRAMUSCULAR | Status: DC | PRN
  Administered 2015-07-25: 6.25 mg via INTRAVENOUS

## 2015-07-25 MED ORDER — CHLORHEXIDINE GLUCONATE 4 % EX LIQD: 60.0000 mL | Freq: Once | CUTANEOUS | Status: DC

## 2015-07-25 MED ORDER — BUPIVACAINE-EPINEPHRINE (PF) 0.5% -1:200000 IJ SOLN
INTRAMUSCULAR | Status: DC | PRN
  Administered 2015-07-25: 20 mL via PERINEURAL

## 2015-07-25 MED ORDER — STERILE WATER FOR IRRIGATION IR SOLN
Status: DC | PRN
  Administered 2015-07-25: 1000 mL

## 2015-07-25 MED ORDER — MIDAZOLAM HCL 5 MG/5ML IJ SOLN
INTRAMUSCULAR | Status: DC | PRN
Start: 2015-07-25 — End: 2015-07-25
  Administered 2015-07-25: 1 mg via INTRAVENOUS

## 2015-07-25 MED ORDER — MENTHOL 3 MG MT LOZG: 1.0000 | LOZENGE | OROMUCOSAL | Status: DC | PRN

## 2015-07-25 MED ORDER — CEFAZOLIN SODIUM 1-5 GM-% IV SOLN
1.0000 g | Freq: Four times a day (QID) | INTRAVENOUS | Status: AC
  Administered 2015-07-25 (×2): 1 g via INTRAVENOUS
  Filled 2015-07-25 (×2): qty 50

## 2015-07-25 MED ORDER — THROMBIN 5000 UNITS EX SOLR
CUTANEOUS | Status: AC
  Filled 2015-07-25: qty 5000

## 2015-07-25 MED ORDER — PHENYLEPHRINE HCL 10 MG/ML IJ SOLN
INTRAMUSCULAR | Status: DC | PRN
  Administered 2015-07-25: 80 ug via INTRAVENOUS

## 2015-07-25 MED ORDER — RIVAROXABAN 10 MG PO TABS
10.0000 mg | ORAL_TABLET | Freq: Every day | ORAL | Status: DC
  Administered 2015-07-26: 10 mg via ORAL
  Filled 2015-07-25 (×3): qty 1

## 2015-07-25 MED ORDER — LIDOCAINE HCL (CARDIAC) 20 MG/ML IV SOLN
INTRAVENOUS | Status: DC | PRN
Start: 2015-07-25 — End: 2015-07-25
  Administered 2015-07-25: 50 mg via INTRAVENOUS

## 2015-07-25 MED ORDER — FENTANYL CITRATE (PF) 250 MCG/5ML IJ SOLN
INTRAMUSCULAR | Status: AC
  Filled 2015-07-25: qty 25

## 2015-07-25 MED ORDER — METHOCARBAMOL 500 MG PO TABS
500.0000 mg | ORAL_TABLET | Freq: Four times a day (QID) | ORAL | Status: DC | PRN
  Administered 2015-07-26 – 2015-07-27 (×2): 500 mg via ORAL
  Filled 2015-07-25 (×2): qty 1

## 2015-07-25 MED ORDER — SUCCINYLCHOLINE CHLORIDE 20 MG/ML IJ SOLN
INTRAMUSCULAR | Status: DC | PRN
  Administered 2015-07-25: 100 mg via INTRAVENOUS

## 2015-07-25 MED ORDER — SUGAMMADEX SODIUM 200 MG/2ML IV SOLN
INTRAVENOUS | Status: AC
  Filled 2015-07-25: qty 2

## 2015-07-25 MED ORDER — METOCLOPRAMIDE HCL 5 MG/ML IJ SOLN
INTRAMUSCULAR | Status: AC
  Filled 2015-07-25: qty 2

## 2015-07-25 MED ORDER — SODIUM CHLORIDE 0.9 % IR SOLN
Status: DC | PRN
  Administered 2015-07-25: 1000 mL

## 2015-07-25 MED ORDER — ONDANSETRON HCL 4 MG/2ML IJ SOLN
INTRAMUSCULAR | Status: AC
  Filled 2015-07-25: qty 2

## 2015-07-25 MED ORDER — BUPIVACAINE LIPOSOME 1.3 % IJ SUSP
INTRAMUSCULAR | Status: DC | PRN
  Administered 2015-07-25: 20 mL

## 2015-07-25 MED ORDER — LACTATED RINGERS IV SOLN
INTRAVENOUS | Status: DC
  Administered 2015-07-25 (×2): via INTRAVENOUS

## 2015-07-25 MED ORDER — FLEET ENEMA 7-19 GM/118ML RE ENEM: 1.0000 | ENEMA | Freq: Once | RECTAL | Status: DC | PRN

## 2015-07-25 MED ORDER — ACETAMINOPHEN 10 MG/ML IV SOLN
INTRAVENOUS | Status: AC
  Filled 2015-07-25: qty 100

## 2015-07-25 MED ORDER — KETOROLAC TROMETHAMINE 30 MG/ML IJ SOLN
INTRAMUSCULAR | Status: AC
  Filled 2015-07-25: qty 1

## 2015-07-25 MED ORDER — SUGAMMADEX SODIUM 200 MG/2ML IV SOLN
INTRAVENOUS | Status: DC | PRN
  Administered 2015-07-25: 155.6 mg via INTRAVENOUS

## 2015-07-25 SURGICAL SUPPLY — 72 items
BAG DECANTER FOR FLEXI CONT (MISCELLANEOUS) IMPLANT
BAG ZIPLOCK 12X15 (MISCELLANEOUS) IMPLANT
BANDAGE ELASTIC 4 VELCRO ST LF (GAUZE/BANDAGES/DRESSINGS) ×3 IMPLANT
BANDAGE ELASTIC 6 VELCRO ST LF (GAUZE/BANDAGES/DRESSINGS) ×3 IMPLANT
BANDAGE ESMARK 6X9 LF (GAUZE/BANDAGES/DRESSINGS) ×1 IMPLANT
BLADE SAG 18X100X1.27 (BLADE) ×3 IMPLANT
BLADE SAW SGTL 11.0X1.19X90.0M (BLADE) ×3 IMPLANT
BNDG ESMARK 6X9 LF (GAUZE/BANDAGES/DRESSINGS) ×3
BONE CEMENT GENTAMICIN (Cement) ×6 IMPLANT
CAP KNEE TOTAL 3 SIGMA ×3 IMPLANT
CEMENT BONE GENTAMICIN 40 (Cement) ×2 IMPLANT
CLOSURE WOUND 1/2 X4 (GAUZE/BANDAGES/DRESSINGS) ×1
CUFF TOURN SGL QUICK 34 (TOURNIQUET CUFF) ×2
CUFF TRNQT CYL 34X4X40X1 (TOURNIQUET CUFF) ×1 IMPLANT
DECANTER SPIKE VIAL GLASS SM (MISCELLANEOUS) ×3 IMPLANT
DRAPE EXTREMITY T 121X128X90 (DRAPE) ×3 IMPLANT
DRAPE INCISE IOBAN 66X45 STRL (DRAPES) IMPLANT
DRAPE POUCH INSTRU U-SHP 10X18 (DRAPES) ×3 IMPLANT
DRAPE SHEET LG 3/4 BI-LAMINATE (DRAPES) ×3 IMPLANT
DRAPE U-SHAPE 47X51 STRL (DRAPES) ×3 IMPLANT
DRSG AQUACEL AG ADV 3.5X10 (GAUZE/BANDAGES/DRESSINGS) ×3 IMPLANT
DRSG OPSITE POSTOP 4X6 (GAUZE/BANDAGES/DRESSINGS) ×3 IMPLANT
DRSG TEGADERM 4X4.75 (GAUZE/BANDAGES/DRESSINGS) ×3 IMPLANT
DURAPREP 26ML APPLICATOR (WOUND CARE) ×3 IMPLANT
ELECT REM PT RETURN 9FT ADLT (ELECTROSURGICAL) ×3
ELECTRODE REM PT RTRN 9FT ADLT (ELECTROSURGICAL) ×1 IMPLANT
EVACUATOR 1/8 PVC DRAIN (DRAIN) ×3 IMPLANT
FACESHIELD WRAPAROUND (MASK) ×15 IMPLANT
GAUZE SPONGE 2X2 8PLY STRL LF (GAUZE/BANDAGES/DRESSINGS) ×1 IMPLANT
GLOVE BIOGEL PI IND STRL 6.5 (GLOVE) ×1 IMPLANT
GLOVE BIOGEL PI IND STRL 8 (GLOVE) ×1 IMPLANT
GLOVE BIOGEL PI INDICATOR 6.5 (GLOVE) ×2
GLOVE BIOGEL PI INDICATOR 8 (GLOVE) ×2
GLOVE ECLIPSE 8.0 STRL XLNG CF (GLOVE) ×6 IMPLANT
GLOVE SURG SS PI 6.5 STRL IVOR (GLOVE) ×3 IMPLANT
GOWN STRL REUS W/TWL LRG LVL3 (GOWN DISPOSABLE) ×3 IMPLANT
GOWN STRL REUS W/TWL XL LVL3 (GOWN DISPOSABLE) ×3 IMPLANT
HANDPIECE INTERPULSE COAX TIP (DISPOSABLE) ×2
IMMOBILIZER KNEE 20 (SOFTGOODS) ×3 IMPLANT
KIT BASIN OR (CUSTOM PROCEDURE TRAY) ×3 IMPLANT
LIQUID BAND (GAUZE/BANDAGES/DRESSINGS) ×3 IMPLANT
MANIFOLD NEPTUNE II (INSTRUMENTS) ×3 IMPLANT
NDL SAFETY ECLIPSE 18X1.5 (NEEDLE) ×2 IMPLANT
NEEDLE HYPO 18GX1.5 SHARP (NEEDLE) ×4
NEEDLE HYPO 22GX1.5 SAFETY (NEEDLE) ×3 IMPLANT
NS IRRIG 1000ML POUR BTL (IV SOLUTION) IMPLANT
PACK TOTAL JOINT (CUSTOM PROCEDURE TRAY) ×3 IMPLANT
PEN SKIN MARKING BROAD (MISCELLANEOUS) ×3 IMPLANT
POSITIONER SURGICAL ARM (MISCELLANEOUS) ×3 IMPLANT
SET HNDPC FAN SPRY TIP SCT (DISPOSABLE) ×1 IMPLANT
SET PAD KNEE POSITIONER (MISCELLANEOUS) ×3 IMPLANT
SPONGE GAUZE 2X2 STER 10/PKG (GAUZE/BANDAGES/DRESSINGS) ×2
SPONGE LAP 18X18 X RAY DECT (DISPOSABLE) IMPLANT
SPONGE SURGIFOAM ABS GEL 100 (HEMOSTASIS) ×3 IMPLANT
STRIP CLOSURE SKIN 1/2X4 (GAUZE/BANDAGES/DRESSINGS) ×2 IMPLANT
SUCTION FRAZIER 12FR DISP (SUCTIONS) ×3 IMPLANT
SUT BONE WAX W31G (SUTURE) ×3 IMPLANT
SUT MNCRL AB 4-0 PS2 18 (SUTURE) ×3 IMPLANT
SUT VIC AB 1 CT1 27 (SUTURE) ×4
SUT VIC AB 1 CT1 27XBRD ANTBC (SUTURE) ×2 IMPLANT
SUT VIC AB 2-0 CT1 27 (SUTURE) ×6
SUT VIC AB 2-0 CT1 TAPERPNT 27 (SUTURE) ×3 IMPLANT
SUT VLOC 180 0 24IN GS25 (SUTURE) ×3 IMPLANT
SYR 20CC LL (SYRINGE) ×6 IMPLANT
SYR 50ML LL SCALE MARK (SYRINGE) ×3 IMPLANT
TOWEL OR 17X26 10 PK STRL BLUE (TOWEL DISPOSABLE) ×3 IMPLANT
TOWEL OR NON WOVEN STRL DISP B (DISPOSABLE) IMPLANT
TOWER CARTRIDGE SMART MIX (DISPOSABLE) ×3 IMPLANT
TRAY FOLEY W/METER SILVER 14FR (SET/KITS/TRAYS/PACK) ×3 IMPLANT
WATER STERILE IRR 1500ML POUR (IV SOLUTION) ×3 IMPLANT
WRAP KNEE MAXI GEL POST OP (GAUZE/BANDAGES/DRESSINGS) ×3 IMPLANT
YANKAUER SUCT BULB TIP 10FT TU (MISCELLANEOUS) ×3 IMPLANT

## 2015-07-25 NOTE — Op Note (Signed)
Frances Morrison, Frances Morrison                  ACCOUNT NO.:  [id number]  MEDICAL RECORD NO.:  [id number]  LOCATION:  [facility]                         FACILITY:  [facility]  PHYSICIAN:  Kipp Brood. Sharel Behne, M.D.DATE OF BIRTH:  1941/09/05  DATE OF PROCEDURE:  07/25/2015 DATE OF DISCHARGE:                              OPERATIVE REPORT   SURGEON:  Kipp Brood. Gladstone Lighter, M.D.  ASSISTANT:  Ardeen Jourdain, [place].  PREOPERATIVE DIAGNOSIS:  Severe primary osteoarthritis with bone on bone and a flexion contracture, right knee.  POSTOPERATIVE DIAGNOSIS:  Severe primary osteoarthritis with bone on bone and a flexion contracture, right knee.  OPERATION:  Right total knee arthroplasty utilizing DePuy system.  I utilized a size 2.5 femoral component, right posterior cruciate sacrificing type.  The tray was a size 2 with the insert size 2.5 rotating platform, 10 mm thickness.  The patellar component was a size 35 with three pegs.  All three components were cemented with gentamicin in cement.  DESCRIPTION OF PROCEDURE:  Under general anesthesia, routine orthopedic prepping and draping was carried out of the right lower extremity. Note, we used ChloraPrep since she was allergic to Betadine.  Betadine causes skin rash.  At this time, the appropriate time-out was carried out.  I also marked the appropriate right leg in the holding area.  She had 2 g of IV Ancef at the beginning of the procedure.  Following this, the leg was exsanguinated with an Esmarch.  Tourniquet was elevated to 325 mmHg.  The leg was placed in the [facility] knee holder.  The knee was flexed and an anterior approach to the knee was carried out.  Following this, a median parapatellar incision was carried out.  The patella was reflected laterally and the knee flexed.  I did medial and lateral meniscectomies and I excised the anterior and posterior cruciate ligaments.  Once this was done, we made our initial drill hole in the intercondylar notch and then  inserted our canal finder.  After that, we removed the canal finer, thoroughly irrigated out the canal.  At that time, we then removed 11 mm from the distal femur, at the angle was a size 5 valgus.  At this time, after this, we then measured the femur to be a size 2.5.  We then made the appropriate anterior, posterior, and chamfering cuts for a size 2.5 femur.  At this particular time, after preparing the femur, we prepared the tibia in the usual fashion.  We removed 6 mm thickness off the affected medial side of the tibia.  We then inserted our lamina spreaders and removed the posterior spurs. Following that, we inserted our instruments to measure the gap with. Following that, we then went on and decided to select a 10 mm thickness insert.  We then went on and completed the keel cut out of the proximal tibial plateau.  We then did our notch, cut out of the distal femur.  We inserted our trial components.  We had excellent fit with a 10 mm thickness insert.  The patella measured to be a size 5.  We did a resurfacing procedure on the patella for a size 5 patella.  Three  drill holes were made in the articular surface of the patella.  At that particular time, all trial components were removed.  We thoroughly water picked out the knee, dried the knee out, cemented all three components in simultaneously with gentamicin in the cement.  Once the cement was hardened, we searched for loose pieces of cement.  I thoroughly irrigated out the knee after that with a water picked again to make sure all the cement was removed.  At this time, I then injected 20 mL of 0.25% plain Marcaine in the soft tissue.  The permanent insert then was inserted was a rotating platform size 2.5 10 mm thickness.  The knee was reduced.  We then went to flexion and extension.  We had good function laterally and medially as well.  We then thoroughly irrigated again to make sure we had all loose pieces of cement out.  I then  packed the posterior area of the knee with some thrombin-soaked Gelfoam.  This was loosely applied.  We then inserted a Hemovac drain and closed the knee in layers in usual fashion.  We injected 20 mL of Exparel with 20 mL of normal saline, and the wound was closed in the usual fashion.          ______________________________ Kipp Brood Gladstone Lighter, M.D.     RAG/MEDQ  D:  07/25/2015  T:  07/25/2015  Job:  [id number]

## 2015-07-25 NOTE — Anesthesia Procedure Notes (Addendum)
Anesthesia Regional Block:  Adductor canal block  Pre-Anesthetic Checklist: ,, timeout performed, Correct Patient, Correct Site, Correct Laterality, Correct Procedure, Correct Position, site marked, Risks and benefits discussed,  Surgical consent,  Pre-op evaluation,  At surgeon's request and post-op pain management  Laterality: Right  Prep: chloraprep       Needles:  Injection technique: Single-shot  Needle Type: Echogenic Needle     Needle Length: 9cm 9 cm Needle Gauge: 21 and 21 G    Additional Needles:  Procedures: ultrasound guided (picture in chart) Adductor canal block Narrative:  Start time: 07/25/2015 8:10 AM End time: 07/25/2015 8:20 AM Injection made incrementally with aspirations every 5 mL.  Performed by: Personally  Anesthesiologist: HODIERNE, ADAM  Additional Notes: Pt tolerated the procedure well.   Procedure Name: Intubation Date/Time: 07/25/2015 8:28 AM Performed by: Aliah Eriksson, Virgel Gess Pre-anesthesia Checklist: Patient identified, Emergency Drugs available, Suction available, Patient being monitored and Timeout performed Patient Re-evaluated:Patient Re-evaluated prior to inductionOxygen Delivery Method: Circle system utilized Preoxygenation: Pre-oxygenation with 100% oxygen Intubation Type: IV induction Ventilation: Mask ventilation without difficulty Laryngoscope Size: Mac and 4 Grade View: Grade II Tube type: Oral Tube size: 7.5 mm Number of attempts: 1 Airway Equipment and Method: Stylet Placement Confirmation: ETT inserted through vocal cords under direct vision,  positive ETCO2,  CO2 detector and breath sounds checked- equal and bilateral Secured at: 21 cm Tube secured with: Tape Dental Injury: Teeth and Oropharynx as per pre-operative assessment

## 2015-07-25 NOTE — Plan of Care (Signed)
Problem: Phase I Progression Outcomes Goal: Initial discharge plan identified Outcome: Completed/Met Date Met:  07/25/15 Pt plans to go home with the support of her husband and son.

## 2015-07-25 NOTE — Anesthesia Postprocedure Evaluation (Signed)
Anesthesia Post Note  Patient: Frances Morrison  Procedure(s) Performed: Procedure(s) (LRB): TOTAL RIGHT KNEE ARTHROPLASTY (Right)  Anesthesia type: General  Patient location: PACU  Post pain: Pain level controlled and Adequate analgesia  Post assessment: Post-op Vital signs reviewed, Patient's Cardiovascular Status Stable, Respiratory Function Stable, Patent Airway and Pain level controlled  Last Vitals:  Filed Vitals:   07/25/15 1245  BP: 130/51  Pulse: 86  Temp:   Resp: 12    Post vital signs: Reviewed and stable  Level of consciousness: awake, alert  and oriented  Complications: No apparent anesthesia complications

## 2015-07-25 NOTE — Progress Notes (Signed)
Utilization review completed.  

## 2015-07-25 NOTE — Interval H&P Note (Signed)
History and Physical Interval Note:  07/25/2015 8:09 AM  Frances Morrison  has presented today for surgery, with the diagnosis of OA RIGHT KNEE   The various methods of treatment have been discussed with the patient and family. After consideration of risks, benefits and other options for treatment, the patient has consented to  Procedure(s): TOTAL RIGHT KNEE ARTHROPLASTY (Right) as a surgical intervention .  The patient's history has been reviewed, patient examined, no change in status, stable for surgery.  I have reviewed the patient's chart and labs.  Questions were answered to the patient's satisfaction.     Jakaden Ouzts A

## 2015-07-25 NOTE — Brief Op Note (Signed)
07/25/2015  10:10 AM  PATIENT:  Frances Morrison  74 y.o. female  PRE-OPERATIVE DIAGNOSIS:Primary  OSTEOARTHRITIS RIGHT KNEE   POST-OPERATIVE DIAGNOSIS:Primary  OSTEOARTHRITIS RIGHT KNEE  PROCEDURE:  Procedure(s): TOTAL RIGHT KNEE ARTHROPLASTY (Right)  SURGEON:  Surgeon(s) and Role:    * Latanya Maudlin, MD - Primary  PHYSICIAN ASSISTANT: Ardeen Jourdain PA  ASSISTANTS:Amber [place] PA  ANESTHESIA:   general  EBL:  Total I/O In: -  Out: 100 [Urine:100]  BLOOD ADMINISTERED:none  DRAINS: (One) Hemovact drain(s) in the Right Knee with  Suction Open   LOCAL MEDICATIONS USED:  MARCAINE  20cc of 0.25% Plain and 20cc of Exparel mixed with 20cc of Normal Saline.  SPECIMEN:  No Specimen  DISPOSITION OF SPECIMEN:  N/A  COUNTS:  YES  TOURNIQUET:  * Missing tourniquet times found for documented tourniquets in log:  [id number] *  DICTATION: .Other Dictation: Dictation Number [id number]  PLAN OF CARE: Admit to inpatient   PATIENT DISPOSITION:  Stable in OR   Delay start of Pharmacological VTE agent (>24hrs) due to surgical blood loss or risk of bleeding: yes

## 2015-07-25 NOTE — Transfer of Care (Signed)
Immediate Anesthesia Transfer of Care Note  Patient: Frances Morrison  Procedure(s) Performed: Procedure(s): TOTAL RIGHT KNEE ARTHROPLASTY (Right)  Patient Location: PACU  Anesthesia Type:GA combined with regional for post-op pain  Level of Consciousness:  sedated, patient cooperative and responds to stimulation  Airway & Oxygen Therapy:Patient Spontanous Breathing and Patient connected to face mask oxgen  Post-op Assessment:  Report given to PACU RN and Post -op Vital signs reviewed and stable  Post vital signs:  Reviewed and stable  Last Vitals:  Filed Vitals:   07/25/15 0555  BP: 157/66  Pulse: 97  Temp: 36.4 C  Resp: 18    Complications: No apparent anesthesia complications

## 2015-07-25 NOTE — Anesthesia Preprocedure Evaluation (Signed)
Anesthesia Evaluation  Patient identified by MRN, date of birth, ID band Patient awake    Reviewed: Allergy & Precautions, NPO status , Patient's Chart, lab work & pertinent test results  History of Anesthesia Complications (+) PONV and history of anesthetic complications  Airway Mallampati: II   Neck ROM: full    Dental   Pulmonary former smoker,    breath sounds clear to auscultation       Cardiovascular hypertension,  Rhythm:regular Rate:Normal     Neuro/Psych    GI/Hepatic GERD  ,  Endo/Other    Renal/GU      Musculoskeletal  (+) Arthritis ,   Abdominal   Peds  Hematology   Anesthesia Other Findings   Reproductive/Obstetrics                             Anesthesia Physical Anesthesia Plan  ASA: II  Anesthesia Plan: General   Post-op Pain Management:    Induction: Intravenous  Airway Management Planned: Oral ETT  Additional Equipment:   Intra-op Plan:   Post-operative Plan: Extubation in OR  Informed Consent: I have reviewed the patients History and Physical, chart, labs and discussed the procedure including the risks, benefits and alternatives for the proposed anesthesia with the patient or authorized representative who has indicated his/her understanding and acceptance.     Plan Discussed with: CRNA, Anesthesiologist and Surgeon  Anesthesia Plan Comments:         Anesthesia Quick Evaluation

## 2015-07-26 LAB — BASIC METABOLIC PANEL
ANION GAP: 7 (ref 5–15)
BUN: 12 mg/dL (ref 6–20)
CHLORIDE: 105 mmol/L (ref 101–111)
CO2: 29 mmol/L (ref 22–32)
Calcium: 8.9 mg/dL (ref 8.9–10.3)
Creatinine, Ser: 0.62 mg/dL (ref 0.44–1.00)
GFR calc non Af Amer: >60 mL/min (ref >60)
Glucose, Bld: 125 mg/dL — ABNORMAL HIGH (ref 65–99)
POTASSIUM: 4.1 mmol/L (ref 3.5–5.1)
SODIUM: 141 mmol/L (ref 135–145)

## 2015-07-26 LAB — CBC
HEMATOCRIT: 29.9 % — AB (ref 36.0–46.0)
Hemoglobin: 9.8 g/dL — ABNORMAL LOW (ref 12.0–15.0)
MCH: 28.8 pg (ref 26.0–34.0)
MCHC: 32.8 g/dL (ref 30.0–36.0)
MCV: 87.9 fL (ref 78.0–100.0)
Platelets: 203 10*3/uL (ref 150–400)
RBC: 3.4 MIL/uL — ABNORMAL LOW (ref 3.87–5.11)
RDW: 14.1 % (ref 11.5–15.5)
WBC: 15.2 10*3/uL — ABNORMAL HIGH (ref 4.0–10.5)

## 2015-07-26 MED ORDER — DIPHENHYDRAMINE HCL 25 MG PO CAPS
ORAL_CAPSULE | ORAL | Status: AC
  Filled 2015-07-26: qty 1

## 2015-07-26 MED ORDER — HYDROCODONE-ACETAMINOPHEN 5-325 MG PO TABS: 1.0000 | ORAL_TABLET | ORAL | Status: DC | PRN

## 2015-07-26 MED ORDER — METHOCARBAMOL 500 MG PO TABS: 500.0000 mg | ORAL_TABLET | Freq: Four times a day (QID) | ORAL | Status: DC | PRN

## 2015-07-26 MED ORDER — DIPHENHYDRAMINE HCL 25 MG PO CAPS
25.0000 mg | ORAL_CAPSULE | Freq: Four times a day (QID) | ORAL | Status: DC | PRN
  Administered 2015-07-26 – 2015-07-27 (×2): 25 mg via ORAL
  Filled 2015-07-26: qty 1

## 2015-07-26 MED ORDER — RIVAROXABAN 10 MG PO TABS: 10.0000 mg | ORAL_TABLET | Freq: Every day | ORAL | Status: DC

## 2015-07-26 NOTE — Care Management Note (Addendum)
Case Management Note  Patient Details  Name: Frances Morrison MRN: [id number] Date of Birth: 04/12/1941  Subjective/Objective:                   TOTAL RIGHT KNEE ARTHROPLASTY (Right) Action/Plan:  Discharge planning Expected Discharge Date:  07/27/15               Expected Discharge Plan:  [place]  In-House Referral:     Discharge planning Services  CM Consult  Post Acute Care Choice:  Home Health Choice offered to:  Patient  DME Arranged:    DME Agency:  [place]:  PT [facility] Agency:  [place]  Status of Service:  Completed, signed off  Medicare Important Message Given:    Date Medicare IM Given:    Medicare IM give by:    Date Additional Medicare IM Given:    Additional Medicare Important Message give by:     If discussed at [place] of Stay Meetings, dates discussed:    Additional Comments: CM met with pt in room to offer choice of home health agency.  Pt chooses [facility] to render HHPT.  Referral called to [facility] rep, Tiffany.  Pt has a bedside commode and rolling walker at home.  Pt requests a tub transfer bench.  Pt states husband will bring payment tomorrow for tub transfer bench and understands the cost is approximately 60.00.  [facility] DME rep, Lecretia aware.  No other CM needs were communicated. Dellie Catholic, RN 07/26/2015, 12:31 PM

## 2015-07-26 NOTE — Discharge Instructions (Addendum)
INSTRUCTIONS AFTER JOINT REPLACEMENT  ° °o Remove items at home which could result in a fall. This includes throw rugs or furniture in walking pathways °o ICE to the affected joint every three hours while awake for 30 minutes at a time, for at least the first 3-5 days, and then as needed for pain and swelling.  Continue to use ice for pain and swelling. You may notice swelling that will progress down to the foot and ankle.  This is normal after surgery.  Elevate your leg when you are not up walking on it.   °o Continue to use the breathing machine you got in the hospital (incentive spirometer) which will help keep your temperature down.  It is common for your temperature to cycle up and down following surgery, especially at night when you are not up moving around and exerting yourself.  The breathing machine keeps your lungs expanded and your temperature down. ° ° °DIET:  As you were doing prior to hospitalization, we recommend a well-balanced diet. ° °DRESSING / WOUND CARE / SHOWERING ° °Keep the surgical dressing until follow up.  The dressing is water proof, so you can shower without any extra covering.  IF THE DRESSING FALLS OFF or the wound gets wet inside, change the dressing with sterile gauze.  Please use good hand washing techniques before changing the dressing.  Do not use any lotions or creams on the incision until instructed by your surgeon.   ° °ACTIVITY ° °o Increase activity slowly as tolerated, but follow the weight bearing instructions below.   °o No driving for 6 weeks or until further direction given by your physician.  You cannot drive while taking narcotics.  °o No lifting or carrying greater than 10 lbs. until further directed by your surgeon. °o Avoid periods of inactivity such as sitting longer than an hour when not asleep. This helps prevent blood clots.  °o You may return to work once you are authorized by your doctor.  ° ° ° °WEIGHT BEARING  ° °Weight bearing as tolerated with assist  device (walker, cane, etc) as directed, use it as long as suggested by your surgeon or therapist, typically at least 4-6 weeks. ° ° °EXERCISES ° °Results after joint replacement surgery are often greatly improved when you follow the exercise, range of motion and muscle strengthening exercises prescribed by your doctor. Safety measures are also important to protect the joint from further injury. Any time any of these exercises cause you to have increased pain or swelling, decrease what you are doing until you are comfortable again and then slowly increase them. If you have problems or questions, call your caregiver or physical therapist for advice.  ° °Rehabilitation is important following a joint replacement. After just a few days of immobilization, the muscles of the leg can become weakened and shrink (atrophy).  These exercises are designed to build up the tone and strength of the thigh and leg muscles and to improve motion. Often times heat used for twenty to thirty minutes before working out will loosen up your tissues and help with improving the range of motion but do not use heat for the first two weeks following surgery (sometimes heat can increase post-operative swelling).  ° °These exercises can be done on a training (exercise) mat, on the floor, on a table or on a bed. Use whatever works the best and is most comfortable for you.    Use music or television while you are exercising so that   the exercises are a pleasant break in your day. This will make your life better with the exercises acting as a break in your routine that you can look forward to.   Perform all exercises about fifteen times, three times per day or as directed.  You should exercise both the operative leg and the other leg as well. ° °Exercises include: °  °• Quad Sets - Tighten up the muscle on the front of the thigh (Quad) and hold for 5-10 seconds.   °• Straight Leg Raises - With your knee straight (if you were given a brace, keep it on),  lift the leg to 60 degrees, hold for 3 seconds, and slowly lower the leg.  Perform this exercise against resistance later as your leg gets stronger.  °• Leg Slides: Lying on your back, slowly slide your foot toward your buttocks, bending your knee up off the floor (only go as far as is comfortable). Then slowly slide your foot back down until your leg is flat on the floor again.  °• Angel Wings: Lying on your back spread your legs to the side as far apart as you can without causing discomfort.  °• Hamstring Strength:  Lying on your back, push your heel against the floor with your leg straight by tightening up the muscles of your buttocks.  Repeat, but this time bend your knee to a comfortable angle, and push your heel against the floor.  You may put a pillow under the heel to make it more comfortable if necessary.  ° °A rehabilitation program following joint replacement surgery can speed recovery and prevent re-injury in the future due to weakened muscles. Contact your doctor or a physical therapist for more information on knee rehabilitation.  ° ° °CONSTIPATION ° °Constipation is defined medically as fewer than three stools per week and severe constipation as less than one stool per week.  Even if you have a regular bowel pattern at home, your normal regimen is likely to be disrupted due to multiple reasons following surgery.  Combination of anesthesia, postoperative narcotics, change in appetite and fluid intake all can affect your bowels.  ° °YOU MUST use at least one of the following options; they are listed in order of increasing strength to get the job done.  They are all available over the counter, and you may need to use some, POSSIBLY even all of these options:   ° °Drink plenty of fluids (prune juice may be helpful) and high fiber foods °Colace 100 mg by mouth twice a day  °Senokot for constipation as directed and as needed Dulcolax (bisacodyl), take with full glass of water  °Miralax (polyethylene glycol)  once or twice a day as needed. ° °If you have tried all these things and are unable to have a bowel movement in the first 3-4 days after surgery call either your surgeon or your primary doctor.   ° °If you experience loose stools or diarrhea, hold the medications until you stool forms back up.  If your symptoms do not get better within 1 week or if they get worse, check with your doctor.  If you experience "the worst abdominal pain ever" or develop nausea or vomiting, please contact the office immediately for further recommendations for treatment. ° ° °ITCHING:  If you experience itching with your medications, try taking only a single pain pill, or even half a pain pill at a time.  You can also use Benadryl over the counter for itching or also to   help with sleep.   TED HOSE STOCKINGS:  Use stockings on both legs until for at least 2 weeks or as directed by physician office. They may be removed at night for sleeping.  MEDICATIONS:  See your medication summary on the After Visit Summary that nursing will review with you.  You may have some home medications which will be placed on hold until you complete the course of blood thinner medication.  It is important for you to complete the blood thinner medication as prescribed.  PRECAUTIONS:  If you experience chest pain or shortness of breath - call 911 immediately for transfer to the hospital emergency department.   If you develop a fever greater that 101 F, purulent drainage from wound, increased redness or drainage from wound, foul odor from the wound/dressing, or calf pain - CONTACT YOUR SURGEON.                                                   FOLLOW-UP APPOINTMENTS:  If you do not already have a post-op appointment, please call the office for an appointment to be seen by your surgeon.  Guidelines for how soon to be seen are listed in your After Visit Summary, but are typically between 1-4 weeks after surgery.  OTHER INSTRUCTIONS:   Do not take  vitamins or supplements while taking blood thinner (Xarelto)  MAKE SURE YOU:   Understand these instructions.   Get help right away if you are not doing well or get worse.    Thank you for letting us be a part of your medical care team.  It is a privilege we respect greatly.  We hope these instructions will help you stay on track for a fast and full recovery!    Information on my medicine - XARELTO (Rivaroxaban)  This medication education was reviewed with me or my healthcare representative as part of my discharge preparation.  The pharmacist that spoke with me during my hospital stay was:  Leeroy Bock, [facility]  Why was Xarelto prescribed for you? Xarelto was prescribed for you to reduce the risk of blood clots forming after orthopedic surgery. The medical term for these abnormal blood clots is venous thromboembolism (VTE).  What do you need to know about xarelto ? Take your Xarelto ONCE DAILY at the same time every day. You may take it either with or without food.  If you have difficulty swallowing the tablet whole, you may crush it and mix in applesauce just prior to taking your dose.  Take Xarelto exactly as prescribed by your doctor and DO NOT stop taking Xarelto without talking to the doctor who prescribed the medication.  Stopping without other VTE prevention medication to take the place of Xarelto may increase your risk of developing a clot.  After discharge, you should have regular check-up appointments with your healthcare provider that is prescribing your Xarelto.    What do you do if you miss a dose? If you miss a dose, take it as soon as you remember on the same day then continue your regularly scheduled once daily regimen the next day. Do not take two doses of Xarelto on the same day.   Important Safety Information A possible side effect of Xarelto is bleeding. You should call your healthcare provider right away if you experience any of the  following: ?  Bleeding from an injury or your nose that does not stop. ? Unusual colored urine (red or dark brown) or unusual colored stools (red or black). ? Unusual bruising for unknown reasons. ? A serious fall or if you hit your head (even if there is no bleeding).  Some medicines may interact with Xarelto and might increase your risk of bleeding while on Xarelto. To help avoid this, consult your healthcare provider or pharmacist prior to using any new prescription or non-prescription medications, including herbals, vitamins, non-steroidal anti-inflammatory drugs (NSAIDs) and supplements.  This website has more information on Xarelto: [email or web address]

## 2015-07-26 NOTE — Evaluation (Signed)
Occupational Therapy Evaluation Patient Details Name: Frances Morrison MRN: [id number] DOB: 21-Sep-1941 Today's Date: 07/26/2015    History of Present Illness Pt is s/p R TKA   Clinical Impression   Pt up to 3in1 with walker for OT session. Pt doing well and husband present and supportive. 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 Will follow on acute to progress ADL independence.     Follow Up Recommendations  No OT follow up;Supervision/Assistance - 24 hour    Equipment Recommendations  Tub/shower bench    Recommendations for Other Services       Precautions / Restrictions Precautions Precautions: Knee Required Braces or Orthoses: Knee Immobilizer - Right Knee Immobilizer - Right: Discontinue once straight leg raise with < 10 degree lag Restrictions Weight Bearing Restrictions: No Other Position/Activity Restrictions: WBAT      Mobility Bed Mobility      General bed mobility comments: up in chair.   Transfers Overall transfer level: Needs assistance Equipment used: Rolling walker (2 wheeled) Transfers: Sit to/from Stand Sit to Stand: Min assist         General transfer comment: cues for hand placement and LE management.    Balance                                            ADL Overall ADL's : Needs assistance/impaired Eating/Feeding: Independent;Sitting   Grooming: Wash/dry hands;Minimal assistance;Standing   Upper Body Bathing: Set up;Sitting   Lower Body Bathing: Moderate assistance;Sit to/from stand   Upper Body Dressing : Set up;Sitting   Lower Body Dressing: Moderate assistance;Sit to/from stand   Toilet Transfer: Minimal assistance;Ambulation;BSC;RW   Toileting- Clothing Manipulation and Hygiene: Minimal assistance;Moderate assistance;Sit to/from stand         General ADL Comments: Discussed at length tub transfers  with pt and spouse. Explained option to sponge bathe initially and  think about a tubseat for when she does get into tub. Also explained option of a tub transfer bench and showed them a picture. Pt and spouse to consider tubbench opton and OT informed case manager they are are inquring about cost.      Vision     Perception     Praxis      Pertinent Vitals/Pain Pain Assessment: 0-10 Pain Score: 5  Pain Location: R knee Pain Descriptors / Indicators: Aching Pain Intervention(s): Repositioned;Ice applied     Hand Dominance Right   Extremity/Trunk Assessment Upper Extremity Assessment Upper Extremity Assessment: Overall WFL for tasks assessed      Cervical / Trunk Assessment Cervical / Trunk Assessment: Normal   Communication Communication Communication: No difficulties   Cognition Arousal/Alertness: Awake/alert Behavior During Therapy: WFL for tasks assessed/performed Overall Cognitive Status: Within Functional Limits for tasks assessed                     General Comments       Exercises Exercises: Total Joint     Shoulder Instructions      Home Living Family/patient expects to be discharged to:: Private residence Living Arrangements: Spouse/significant other Available Help at Discharge: Family Type of Home: House Home Access: Stairs to enter [employer] of Steps: 1 + 1 Entrance Stairs-Rails: None Home Layout: One level     Bathroom Shower/Tub: Teacher, early years/pre: [place]: Bedside  commode;Walker - 2 wheels          Prior Functioning/Environment Level of Independence: Independent with assistive device(s)             OT Diagnosis: Generalized weakness   OT Problem List: Decreased strength;Decreased knowledge of use of DME or AE   OT Treatment/Interventions: Self-care/ADL training;Patient/family education;Therapeutic activities;DME and/or AE instruction    OT Goals(Current goals can be found in the care plan section) Acute Rehab OT Goals Patient Stated  Goal: decrease pain and increase independence.  OT Goal Formulation: With patient Time For Goal Achievement: 08/02/15 Potential to Achieve Goals: Good  OT Frequency: Min 2X/week   Barriers to D/C:            Co-evaluation              End of Session Equipment Utilized During Treatment: Rolling walker;Gait belt;Right knee immobilizer  Activity Tolerance: Patient tolerated treatment well Patient left: in chair;with call bell/phone within reach;with family/visitor present   Time: 1694-5038 OT Time Calculation (min): 29 min Charges:  OT General Charges $OT Visit: 1 Procedure OT Evaluation $Initial OT Evaluation Tier I: 1 Procedure OT Treatments $Therapeutic Activity: 8-22 mins G-Codes:    Frances Morrison  [phone] 07/26/2015, 1:13 PM

## 2015-07-26 NOTE — Progress Notes (Signed)
Physical Therapy Treatment Patient Details Name: Frances Morrison MRN: [id number] DOB: [birth year] Today's Date: 07/26/2015    History of Present Illness Pt is s/p R TKA    PT Comments    Pt motivated and progressing well with mobility.  Pt hopeful for dc home tomorrow.  Follow Up Recommendations  Home health PT     Equipment Recommendations  None recommended by PT    Recommendations for Other Services OT consult     Precautions / Restrictions Precautions Precautions: Knee Required Braces or Orthoses: Knee Immobilizer - Right Knee Immobilizer - Right: Discontinue once straight leg raise with < 10 degree lag Restrictions Weight Bearing Restrictions: No Other Position/Activity Restrictions: WBAT    Mobility  Bed Mobility Overal bed mobility: Needs Assistance Bed Mobility: Sit to Supine       Sit to supine: Min assist   General bed mobility comments: cues for sequence and use of L LE to self assist  Transfers Overall transfer level: Needs assistance Equipment used: Rolling walker (2 wheeled) Transfers: Sit to/from Stand Sit to Stand: Min assist         General transfer comment: cues for hand placement and LE management.  Ambulation/Gait Ambulation/Gait assistance: Min assist Ambulation Distance (Feet): 115 Feet Assistive device: Rolling walker (2 wheeled) Gait Pattern/deviations: Step-to pattern;Decreased step length - right;Decreased step length - left;Trunk flexed;Shuffle     General Gait Details: Cues for sequence, posture, position from RW    Stairs            Wheelchair Mobility    Modified Rankin (Stroke Patients Only)       Balance                                    Cognition Arousal/Alertness: Awake/alert Behavior During Therapy: WFL for tasks assessed/performed Overall Cognitive Status: Within Functional Limits for tasks assessed                      Exercises Total Joint Exercises Ankle Circles/Pumps:  AROM;Both;15 reps;Supine Quad Sets: AROM;Both;10 reps;Supine Heel Slides: AAROM;Right;10 reps;Supine Straight Leg Raises: AAROM;Right;10 reps;Supine    General Comments        Pertinent Vitals/Pain Pain Assessment: 0-10 Pain Score: 5  Pain Location: R knee Pain Descriptors / Indicators: Aching;Sore Pain Intervention(s): Limited activity within patient's tolerance;Monitored during session;Premedicated before session;Ice applied    Home Living Family/patient expects to be discharged to:: Private residence Living Arrangements: Spouse/significant other Available Help at Discharge: Family Type of Home: House       Home Equipment: Bedside commode;Walker - 2 wheels      Prior Function Level of Independence: Independent with assistive device(s)          PT Goals (current goals can now be found in the care plan section) Acute Rehab PT Goals Patient Stated Goal: decrease pain and increase independence.  PT Goal Formulation: With patient Time For Goal Achievement: 07/31/15 Potential to Achieve Goals: Good Progress towards PT goals: Progressing toward goals    Frequency  7X/week    PT Plan Current plan remains appropriate    Co-evaluation             End of Session Equipment Utilized During Treatment: Gait belt;Right knee immobilizer Activity Tolerance: Patient tolerated treatment well Patient left: in bed;with call bell/phone within reach;with family/visitor present     Time: 1453-1520 PT Time Calculation (min) (ACUTE ONLY): 27  min  Charges:  $Gait Training: 8-22 mins $Therapeutic Exercise: 8-22 mins                    G Codes:      Nafis Farnan 08-17-15, 4:23 PM

## 2015-07-26 NOTE — Evaluation (Signed)
Physical Therapy Evaluation Patient Details Name: Frances Morrison MRN: [id number] DOB: [birth year] Today's Date: 07/26/2015   History of Present Illness  Pt is s/p R TKA  Clinical Impression  Pt s/p R TKR presents with decreased R LE strength/ROM and post op pain limiting functional mobility.  Pt should progress to dc home with family assist and HHPT follow up.    Follow Up Recommendations Home health PT    Equipment Recommendations  None recommended by PT    Recommendations for Other Services OT consult     Precautions / Restrictions Precautions Precautions: Knee Required Braces or Orthoses: Knee Immobilizer - Right Knee Immobilizer - Right: Discontinue once straight leg raise with < 10 degree lag Restrictions Weight Bearing Restrictions: No Other Position/Activity Restrictions: WBAT      Mobility  Bed Mobility Overal bed mobility: Needs Assistance Bed Mobility: Supine to Sit     Supine to sit: Min assist     General bed mobility comments: cues for sequence and use of L LE to self assist  Transfers Overall transfer level: Needs assistance Equipment used: Rolling walker (2 wheeled) Transfers: Sit to/from Stand Sit to Stand: Min assist         General transfer comment: cues for LE management and use of UEs to self assist  Ambulation/Gait Ambulation/Gait assistance: Min assist Ambulation Distance (Feet): 64 Feet Assistive device: Rolling walker (2 wheeled) Gait Pattern/deviations: Step-to pattern;Decreased step length - right;Decreased step length - left;Shuffle;Trunk flexed     General Gait Details: Cues for sequence, posture, position from [employer]            Wheelchair Mobility    Modified Rankin (Stroke Patients Only)       Balance                                             Pertinent Vitals/Pain Pain Assessment: 0-10 Pain Score: 5  Pain Location: R knee Pain Descriptors / Indicators: Aching;Sore Pain  Intervention(s): Limited activity within patient's tolerance;Monitored during session;Premedicated before session;Ice applied    Home Living Family/patient expects to be discharged to:: Private residence Living Arrangements: Spouse/significant other Available Help at Discharge: Family Type of Home: House Home Access: Stairs to enter Entrance Stairs-Rails: None Entrance Stairs-Number of Steps: 1 + 1 Home Layout: One level Home Equipment: Walker - 2 wheels      Prior Function Level of Independence: Independent;Independent with assistive device(s)               Hand Dominance   Dominant Hand: Right    Extremity/Trunk Assessment   Upper Extremity Assessment: Overall WFL for tasks assessed           Lower Extremity Assessment: RLE deficits/detail RLE Deficits / Details: 3-/5 quads with AAROM at knee -10-  40    Cervical / Trunk Assessment: Normal  Communication   Communication: No difficulties  Cognition Arousal/Alertness: Awake/alert Behavior During Therapy: WFL for tasks assessed/performed Overall Cognitive Status: Within Functional Limits for tasks assessed                      General Comments      Exercises Total Joint Exercises Ankle Circles/Pumps: AROM;Both;15 reps;Supine Quad Sets: AROM;Both;10 reps;Supine Heel Slides: AAROM;Right;10 reps;Supine Straight Leg Raises: AAROM;Right;10 reps;Supine      Assessment/Plan    PT Assessment Patient needs continued  PT services  PT Diagnosis Difficulty walking   PT Problem List Decreased strength;Decreased range of motion;Decreased activity tolerance;Decreased mobility;Decreased knowledge of use of DME;Pain  PT Treatment Interventions DME instruction;Gait training;Stair training;Functional mobility training;Therapeutic activities;Therapeutic exercise;Patient/family education   PT Goals (Current goals can be found in the Care Plan section) Acute Rehab PT Goals Patient Stated Goal: Resume previous  lifestyle with decreased pain PT Goal Formulation: With patient Time For Goal Achievement: 07/31/15 Potential to Achieve Goals: Good    Frequency 7X/week   Barriers to discharge        Co-evaluation               End of Session Equipment Utilized During Treatment: Gait belt;Right knee immobilizer Activity Tolerance: Patient tolerated treatment well Patient left: in chair;with call bell/phone within reach;with nursing/sitter in room Nurse Communication: Mobility status         Time: 6606-0045 PT Time Calculation (min) (ACUTE ONLY): 38 min   Charges:   PT Evaluation $Initial PT Evaluation Tier I: 1 Procedure PT Treatments $Gait Training: 8-22 mins $Therapeutic Exercise: 8-22 mins   PT G Codes:        Kolton Kienle August 22, 2015, 12:52 PM

## 2015-07-26 NOTE — Plan of Care (Signed)
Problem: Consults Goal: Diagnosis- Total Joint Replacement Outcome: Completed/Met Date Met:  07/26/15 Primary Total Knee RIGHT

## 2015-07-26 NOTE — Progress Notes (Signed)
   Subjective: 1 Day Post-Op Procedure(s) (LRB): TOTAL RIGHT KNEE ARTHROPLASTY (Right) Patient reports pain as mild.   Patient seen in rounds for Dr. Gladstone Lighter. Patient is well, and has had no acute complaints or problems. Reports that he has had minimal pain and it has been controlled with pain meds. No issues overnight. She said that surprisingly she slept well last night. No SOB or chest pain.  Plan is to go Home after hospital stay.  Objective: Vital signs in last 24 hours: Temp:  [97.7 F (36.5 C)-99.1 F (37.3 C)] 97.8 F (36.6 C) (10/20 0500) Pulse Rate:  [61-96] 61 (10/20 0500) Resp:  [10-20] 16 (10/20 0500) BP: (121-150)/(50-67) 140/67 mmHg (10/20 0500) SpO2:  [92 %-100 %] 98 % (10/20 0500) Weight:  [77.111 kg (170 lb)] 77.111 kg (170 lb) (10/19 1338)  Intake/Output from previous day:  Intake/Output Summary (Last 24 hours) at 07/26/15 0723 Last data filed at 07/26/15 0549  Gross per 24 hour  Intake 3713.33 ml  Output   3898 ml  Net -184.67 ml     Labs:  Recent Labs  07/26/15 0518  HGB 9.8*    Recent Labs  07/26/15 0518  WBC 15.2*  RBC 3.40*  HCT 29.9*  PLT 203    Recent Labs  07/26/15 0518  NA 141  K 4.1  CL 105  CO2 29  BUN 12  CREATININE 0.62  GLUCOSE 125*  CALCIUM 8.9    EXAM General - Patient is Alert and Oriented Extremity - Neurologically intact Neurovascular intact Dorsiflexion/Plantar flexion intact Compartment soft Dressing - dressing C/D/I Motor Function - intact, moving foot and toes well on exam.  Hemovac pulled without difficulty.  Past Medical History  Diagnosis Date  . Cervical cancer ([place])   . Hypertension   . PONV (postoperative nausea and vomiting)   . GERD (gastroesophageal reflux disease)   . Arthritis     arthritis -feet, hands knee    Assessment/Plan: 1 Day Post-Op Procedure(s) (LRB): TOTAL RIGHT KNEE ARTHROPLASTY (Right) Active Problems:   History of total knee arthroplasty  Estimated body mass index  is 30.12 kg/(m^2) as calculated from the following:   Height as of this encounter: 5\' 3"  (1.6 m).   Weight as of this encounter: 77.111 kg (170 lb). Advance diet Up with therapy D/C IV fluids when tolerating POs well  DVT Prophylaxis - Xarelto Weight-Bearing as tolerated   She is doing very well. Work with PT today. Plan for DC home with HHPT tomorrow.   Ardeen Jourdain, PA-C Orthopaedic Surgery 07/26/2015, 7:23 AM

## 2015-07-27 LAB — BASIC METABOLIC PANEL
ANION GAP: 5 (ref 5–15)
BUN: 22 mg/dL — AB (ref 6–20)
CO2: 32 mmol/L (ref 22–32)
Calcium: 8.5 mg/dL — ABNORMAL LOW (ref 8.9–10.3)
Chloride: 103 mmol/L (ref 101–111)
Creatinine, Ser: 0.69 mg/dL (ref 0.44–1.00)
GFR calc Af Amer: >60 mL/min (ref >60)
GFR calc non Af Amer: >60 mL/min (ref >60)
GLUCOSE: 111 mg/dL — AB (ref 65–99)
POTASSIUM: 3.8 mmol/L (ref 3.5–5.1)
Sodium: 140 mmol/L (ref 135–145)

## 2015-07-27 LAB — CBC
HEMATOCRIT: 27.8 % — AB (ref 36.0–46.0)
HEMOGLOBIN: 8.8 g/dL — AB (ref 12.0–15.0)
MCH: 28.6 pg (ref 26.0–34.0)
MCHC: 31.7 g/dL (ref 30.0–36.0)
MCV: 90.3 fL (ref 78.0–100.0)
Platelets: 190 10*3/uL (ref 150–400)
RBC: 3.08 MIL/uL — AB (ref 3.87–5.11)
RDW: 14.6 % (ref 11.5–15.5)
WBC: 9.4 10*3/uL (ref 4.0–10.5)

## 2015-07-27 MED ORDER — FERROUS SULFATE 325 (65 FE) MG PO TABS: 325.0000 mg | ORAL_TABLET | Freq: Two times a day (BID) | ORAL | Status: DC

## 2015-07-27 MED ORDER — ASPIRIN EC 325 MG PO TBEC
325.0000 mg | DELAYED_RELEASE_TABLET | Freq: Two times a day (BID) | ORAL | Status: DC
  Administered 2015-07-27: 325 mg via ORAL
  Filled 2015-07-27 (×3): qty 1

## 2015-07-27 MED ORDER — ASPIRIN EC 325 MG PO TBEC: 325.0000 mg | DELAYED_RELEASE_TABLET | Freq: Two times a day (BID) | ORAL | Status: DC

## 2015-07-27 NOTE — Care Management Important Message (Signed)
Important Message  Patient Details  Name: Frances Morrison MRN: [id number] Date of Birth: 1940-10-11   Medicare Important Message Given:  Yes-second notification given    Camillo Flaming 07/27/2015, 10:07 AMImportant Message  Patient Details  Name: Frances Morrison MRN: [id number] Date of Birth: 08-May-1941   Medicare Important Message Given:  Yes-second notification given    Camillo Flaming 07/27/2015, 10:07 AM

## 2015-07-27 NOTE — Progress Notes (Signed)
Physical Therapy Treatment Patient Details Name: Frances Morrison MRN: [id number] DOB: [birth year] Today's Date: 07/27/2015    History of Present Illness Pt is s/p R TKA    PT Comments    Pt progressing well with mobility and eager for return home.  Reviewed step with pt and spouse.  Follow Up Recommendations  Home health PT     Equipment Recommendations  None recommended by PT    Recommendations for Other Services OT consult     Precautions / Restrictions Precautions Precautions: Knee Required Braces or Orthoses: Knee Immobilizer - Right Knee Immobilizer - Right: Discontinue once straight leg raise with < 10 degree lag (Pt performed IND SLR this am) Restrictions Weight Bearing Restrictions: No Other Position/Activity Restrictions: WBAT    Mobility  Bed Mobility Overal bed mobility: Needs Assistance Bed Mobility: Supine to Sit     Supine to sit: Supervision     General bed mobility comments: cues for sequence and use of L LE to self assist  Transfers Overall transfer level: Needs assistance Equipment used: Rolling walker (2 wheeled) Transfers: Sit to/from Stand Sit to Stand: Supervision         General transfer comment: cues for hand placement and LE management.  Ambulation/Gait Ambulation/Gait assistance: Min guard;Supervision Ambulation Distance (Feet): 140 Feet Assistive device: Rolling walker (2 wheeled) Gait Pattern/deviations: Step-to pattern;Step-through pattern;Decreased step length - right;Decreased step length - left;Shuffle;Trunk flexed Gait velocity: decr   General Gait Details: Cues for sequence, posture, position from RW    Stairs Stairs: Yes Stairs assistance: Min assist Stair Management: No rails;Step to pattern;With walker;Backwards;Forwards Number of Stairs: 3 General stair comments: Single step fwd and twice bkwd with RW and min assist for balance.  Cues for sequence and foot/RW placement  Wheelchair Mobility    Modified Rankin  (Stroke Patients Only)       Balance                                    Cognition Arousal/Alertness: Awake/alert Behavior During Therapy: WFL for tasks assessed/performed Overall Cognitive Status: Within Functional Limits for tasks assessed                      Exercises Total Joint Exercises Ankle Circles/Pumps: AROM;Both;15 reps;Supine Quad Sets: AROM;Both;Supine;15 reps Heel Slides: AAROM;Right;Supine;15 reps Straight Leg Raises: AAROM;Right;Supine;15 reps    General Comments        Pertinent Vitals/Pain Pain Assessment: 0-10 Pain Score: 5  Pain Location: R knee Pain Descriptors / Indicators: Aching;Sore Pain Intervention(s): Limited activity within patient's tolerance;Monitored during session;Premedicated before session;Ice applied    Home Living                      Prior Function            PT Goals (current goals can now be found in the care plan section) Acute Rehab PT Goals Patient Stated Goal: decrease pain and increase independence.  PT Goal Formulation: With patient Time For Goal Achievement: 07/31/15 Potential to Achieve Goals: Good Progress towards PT goals: Progressing toward goals    Frequency  7X/week    PT Plan Current plan remains appropriate    Co-evaluation             End of Session Equipment Utilized During Treatment: Gait belt;Right knee immobilizer Activity Tolerance: Patient tolerated treatment well Patient left: with call bell/phone within reach;with  family/visitor present;in chair     Time: [phone] PT Time Calculation (min) (ACUTE ONLY): 32 min  Charges:  $Gait Training: 8-22 mins $Therapeutic Exercise: 8-22 mins                    G Codes:      Cannan Beeck August 14, 2015, 12:33 PM

## 2015-07-27 NOTE — Progress Notes (Signed)
   Subjective: 2 Days Post-Op Procedure(s) (LRB): TOTAL RIGHT KNEE ARTHROPLASTY (Right) Patient reports pain as moderate.   Patient seen in rounds with Dr. Gladstone Lighter. Patient is well this morning. She does reports some soreness in the knee following therapy yesterday. She developed a rash yesterday. No SOB or chest pain. Voiding well.  Plan is to go Home after hospital stay.  Objective: Vital signs in last 24 hours: Temp:  [97.5 F (36.4 C)-98.9 F (37.2 C)] 97.8 F (36.6 C) (10/21 0640) Pulse Rate:  [77-89] 86 (10/21 0640) Resp:  [15-16] 16 (10/21 0640) BP: (107-139)/(47-92) 107/92 mmHg (10/21 0640) SpO2:  [90 %-100 %] 90 % (10/21 0640)  Intake/Output from previous day:  Intake/Output Summary (Last 24 hours) at 07/27/15 0726 Last data filed at 07/27/15 0648  Gross per 24 hour  Intake    900 ml  Output   1800 ml  Net   -900 ml     Labs:  Recent Labs  07/26/15 0518 07/27/15 0535  HGB 9.8* 8.8*    Recent Labs  07/26/15 0518 07/27/15 0535  WBC 15.2* 9.4  RBC 3.40* 3.08*  HCT 29.9* 27.8*  PLT 203 190    Recent Labs  07/26/15 0518 07/27/15 0535  NA 141 140  K 4.1 3.8  CL 105 103  CO2 29 32  BUN 12 22*  CREATININE 0.62 0.69  GLUCOSE 125* 111*  CALCIUM 8.9 8.5*    EXAM General - Patient is Alert and Oriented Extremity - Neurologically intact Intact pulses distally Dorsiflexion/Plantar flexion intact Compartment soft Dressing/Incision - clean, dry, no drainage Motor Function - intact, moving foot and toes well on exam.  Diffuse maculopapular rash across forearms and back  Past Medical History  Diagnosis Date  . Cervical cancer ([place])   . Hypertension   . PONV (postoperative nausea and vomiting)   . GERD (gastroesophageal reflux disease)   . Arthritis     arthritis -feet, hands knee    Assessment/Plan: 2 Days Post-Op Procedure(s) (LRB): TOTAL RIGHT KNEE ARTHROPLASTY (Right) Active Problems:   History of total knee arthroplasty  Estimated  body mass index is 30.12 kg/(m^2) as calculated from the following:   Height as of this encounter: 5\' 3"  (1.6 m).   Weight as of this encounter: 77.111 kg (170 lb). Advance diet Up with therapy D/C IV fluids Discharge home with home health  DVT Prophylaxis - Xarelto but will switch to aspirin Weight-Bearing as tolerated  Overall she is doing well. Will DC Xarelto as it appears to be the culprit for the rash. Will switch to aspirin. DC home today.   Ardeen Jourdain, PA-C Orthopaedic Surgery 07/27/2015, 7:26 AM

## 2015-07-27 NOTE — Plan of Care (Signed)
Problem: Discharge Progression Outcomes Goal: Anticoagulant follow-up in place Outcome: Not Applicable Date Met:  73/75/05 ASA VTE, no f/u needed.

## 2015-07-27 NOTE — Progress Notes (Signed)
OT Cancellation Note  Patient Details Name: Frances Morrison MRN: [id number] DOB: May 28, 1941   Cancelled Treatment:    Reason Eval/Treat Not Completed: Other (comment) Checked with pt before d/c today and she states she feels comfortable with ADL and states she doesn't feel she needs to practice anything further. Offered to practice 3in1 and tub transfer bench transfers but pt declines stating she feels like she knows how to use tubbench and will have help and states has been up to 3in1 many times and feels comfortable.   [name]  [phone] 07/27/2015, 11:25 AM

## 2015-07-31 NOTE — Discharge Summary (Signed)
Physician Discharge Summary   Patient ID: Frances Morrison MRN: [id number] DOB/AGE: 06-20-1941 74 y.o.  Admit date: 07/25/2015 Discharge date: 07/27/2015  Primary Diagnosis: Primary osteoarthritis, right knee  Admission Diagnoses:  Past Medical History  Diagnosis Date  . Cervical cancer ([place])   . Hypertension   . PONV (postoperative nausea and vomiting)   . GERD (gastroesophageal reflux disease)   . Arthritis     arthritis -feet, hands knee   Discharge Diagnoses:   Active Problems:   History of total knee arthroplasty  Estimated body mass index is 30.12 kg/(m^2) as calculated from the following:   Height as of this encounter: '5\' 3"'  (1.6 m).   Weight as of this encounter: 77.111 kg (170 lb).  Procedure:  Procedure(s) (LRB): TOTAL RIGHT KNEE ARTHROPLASTY (Right)   Consults: None  HPI: Frances Morrison, 74 y.o. female, has a history of pain and functional disability in the right knee due to arthritis and has failed non-surgical conservative treatments for greater than 12 weeks to includeNSAID's and/or analgesics, corticosteriod injections and activity modification. Onset of symptoms was gradual, starting 2 years ago with gradually worsening course since that time. The patient noted prior procedures on the knee to include arthroscopy and menisectomy on the right knee(s). Patient currently rates pain in the right knee(s) at 7 out of 10 with activity. Patient has night pain, worsening of pain with activity and weight bearing, pain that interferes with activities of daily living, pain with passive range of motion, crepitus and joint swelling. Patient has evidence of periarticular osteophytes and joint space narrowing by imaging studies. There is no active infection.  Laboratory Data: Admission on 07/25/2015, Discharged on 07/27/2015  Component Date Value Ref Range Status  . ABO/RH(D) 07/25/2015 A POS   Final  . Antibody Screen 07/25/2015 NEG   Final  . Sample Expiration 07/25/2015  07/28/2015   Final  . ABO/RH(D) 07/25/2015 A POS   Final  . WBC 07/26/2015 15.2* 4.0 - 10.5 K/uL Final  . RBC 07/26/2015 3.40* 3.87 - 5.11 MIL/uL Final  . Hemoglobin 07/26/2015 9.8* 12.0 - 15.0 g/dL Final  . HCT 07/26/2015 29.9* 36.0 - 46.0 % Final  . MCV 07/26/2015 87.9  78.0 - 100.0 fL Final  . MCH 07/26/2015 28.8  26.0 - 34.0 pg Final  . MCHC 07/26/2015 32.8  30.0 - 36.0 g/dL Final  . RDW 07/26/2015 14.1  11.5 - 15.5 % Final  . Platelets 07/26/2015 203  150 - 400 K/uL Final  . Sodium 07/26/2015 141  135 - 145 mmol/L Final  . Potassium 07/26/2015 4.1  3.5 - 5.1 mmol/L Final  . Chloride 07/26/2015 105  101 - 111 mmol/L Final  . CO2 07/26/2015 29  22 - 32 mmol/L Final  . Glucose, Bld 07/26/2015 125* 65 - 99 mg/dL Final  . BUN 07/26/2015 12  6 - 20 mg/dL Final  . Creatinine, Ser 07/26/2015 0.62  0.44 - 1.00 mg/dL Final  . Calcium 07/26/2015 8.9  8.9 - 10.3 mg/dL Final  . GFR calc non Af Amer 07/26/2015 >60  >60 mL/min Final  . GFR calc Af Amer 07/26/2015 >60  >60 mL/min Final   Comment: (NOTE) The eGFR has been calculated using the CKD EPI equation. This calculation has not been validated in all clinical situations. eGFR's persistently <60 mL/min signify possible Chronic Kidney Disease.   . Anion gap 07/26/2015 7  5 - 15 Final  . WBC 07/27/2015 9.4  4.0 - 10.5 K/uL Final  . RBC  07/27/2015 3.08* 3.87 - 5.11 MIL/uL Final  . Hemoglobin 07/27/2015 8.8* 12.0 - 15.0 g/dL Final  . HCT 07/27/2015 27.8* 36.0 - 46.0 % Final  . MCV 07/27/2015 90.3  78.0 - 100.0 fL Final  . MCH 07/27/2015 28.6  26.0 - 34.0 pg Final  . MCHC 07/27/2015 31.7  30.0 - 36.0 g/dL Final  . RDW 07/27/2015 14.6  11.5 - 15.5 % Final  . Platelets 07/27/2015 190  150 - 400 K/uL Final  . Sodium 07/27/2015 140  135 - 145 mmol/L Final  . Potassium 07/27/2015 3.8  3.5 - 5.1 mmol/L Final  . Chloride 07/27/2015 103  101 - 111 mmol/L Final  . CO2 07/27/2015 32  22 - 32 mmol/L Final  . Glucose, Bld 07/27/2015 111* 65 - 99  mg/dL Final  . BUN 07/27/2015 22* 6 - 20 mg/dL Final  . Creatinine, Ser 07/27/2015 0.69  0.44 - 1.00 mg/dL Final  . Calcium 07/27/2015 8.5* 8.9 - 10.3 mg/dL Final  . GFR calc non Af Amer 07/27/2015 >60  >60 mL/min Final  . GFR calc Af Amer 07/27/2015 >60  >60 mL/min Final   Comment: (NOTE) The eGFR has been calculated using the CKD EPI equation. This calculation has not been validated in all clinical situations. eGFR's persistently <60 mL/min signify possible Chronic Kidney Disease.   Georgiann Hahn gap 07/27/2015 5  5 - 15 Final  Hospital Outpatient Visit on 07/11/2015  Component Date Value Ref Range Status  . MRSA, PCR 07/11/2015 NEGATIVE  NEGATIVE Final  . Staphylococcus aureus 07/11/2015 POSITIVE* NEGATIVE Final   Comment:        The Xpert SA Assay (FDA approved for NASAL specimens in patients over 60 years of age), is one component of a comprehensive surveillance program.  Test performance has been validated by [facility] for patients greater than or equal to 36 year old. It is not intended to diagnose infection nor to guide or monitor treatment.   . WBC 07/11/2015 7.3  4.0 - 10.5 K/uL Final  . RBC 07/11/2015 4.02  3.87 - 5.11 MIL/uL Final  . Hemoglobin 07/11/2015 11.3* 12.0 - 15.0 g/dL Final  . HCT 07/11/2015 35.0* 36.0 - 46.0 % Final  . MCV 07/11/2015 87.1  78.0 - 100.0 fL Final  . MCH 07/11/2015 28.1  26.0 - 34.0 pg Final  . MCHC 07/11/2015 32.3  30.0 - 36.0 g/dL Final  . RDW 07/11/2015 13.9  11.5 - 15.5 % Final  . Platelets 07/11/2015 227  150 - 400 K/uL Final  . Neutrophils Relative % 07/11/2015 67   Final  . Neutro Abs 07/11/2015 5.0  1.7 - 7.7 K/uL Final  . Lymphocytes Relative 07/11/2015 23   Final  . Lymphs Abs 07/11/2015 1.7  0.7 - 4.0 K/uL Final  . Monocytes Relative 07/11/2015 7   Final  . Monocytes Absolute 07/11/2015 0.5  0.1 - 1.0 K/uL Final  . Eosinophils Relative 07/11/2015 3   Final  . Eosinophils Absolute 07/11/2015 0.2  0.0 - 0.7 K/uL Final  .  Basophils Relative 07/11/2015 0   Final  . Basophils Absolute 07/11/2015 0.0  0.0 - 0.1 K/uL Final  . Sodium 07/11/2015 141  135 - 145 mmol/L Final  . Potassium 07/11/2015 3.9  3.5 - 5.1 mmol/L Final  . Chloride 07/11/2015 106  101 - 111 mmol/L Final  . CO2 07/11/2015 28  22 - 32 mmol/L Final  . Glucose, Bld 07/11/2015 100* 65 - 99 mg/dL Final  . BUN 07/11/2015  19  6 - 20 mg/dL Final  . Creatinine, Ser 07/11/2015 0.69  0.44 - 1.00 mg/dL Final  . Calcium 07/11/2015 9.4  8.9 - 10.3 mg/dL Final  . Total Protein 07/11/2015 7.5  6.5 - 8.1 g/dL Final  . Albumin 07/11/2015 4.2  3.5 - 5.0 g/dL Final  . AST 07/11/2015 27  15 - 41 U/L Final  . ALT 07/11/2015 20  14 - 54 U/L Final  . Alkaline Phosphatase 07/11/2015 95  38 - 126 U/L Final  . Total Bilirubin 07/11/2015 0.2* 0.3 - 1.2 mg/dL Final  . GFR calc non Af Amer 07/11/2015 >60  >60 mL/min Final  . GFR calc Af Amer 07/11/2015 >60  >60 mL/min Final   Comment: (NOTE) The eGFR has been calculated using the CKD EPI equation. This calculation has not been validated in all clinical situations. eGFR's persistently <60 mL/min signify possible Chronic Kidney Disease.   . Anion gap 07/11/2015 7  5 - 15 Final  . Prothrombin Time 07/11/2015 13.9  11.6 - 15.2 seconds Final  . INR 07/11/2015 1.05  0.00 - 1.49 Final  . Color, Urine 07/11/2015 YELLOW  YELLOW Final  . APPearance 07/11/2015 CLEAR  CLEAR Final  . Specific Gravity, Urine 07/11/2015 1.008  1.005 - 1.030 Final  . pH 07/11/2015 6.0  5.0 - 8.0 Final  . Glucose, UA 07/11/2015 NEGATIVE  NEGATIVE mg/dL Final  . Hgb urine dipstick 07/11/2015 NEGATIVE  NEGATIVE Final  . Bilirubin Urine 07/11/2015 NEGATIVE  NEGATIVE Final  . Ketones, ur 07/11/2015 NEGATIVE  NEGATIVE mg/dL Final  . Protein, ur 07/11/2015 NEGATIVE  NEGATIVE mg/dL Final  . Urobilinogen, UA 07/11/2015 0.2  0.0 - 1.0 mg/dL Final  . Nitrite 07/11/2015 NEGATIVE  NEGATIVE Final  . Leukocytes, UA 07/11/2015 NEGATIVE  NEGATIVE Final    MICROSCOPIC NOT DONE ON URINES WITH NEGATIVE PROTEIN, BLOOD, LEUKOCYTES, NITRITE, OR GLUCOSE <1000 mg/dL.     X-Rays:Dg Chest 2 View  07/11/2015  CLINICAL DATA:  Preoperative examination prior to knee arthroplasty, no cardiopulmonary history, discontinued smoking five years ago. EXAM: CHEST  2 VIEW COMPARISON:  None. FINDINGS: The lungs are mildly hyperinflated with hemidiaphragm flattening and increased AP dimension of the thorax. There is no alveolar infiltrate. There is no pleural effusion or pneumothorax. The heart and pulmonary vascularity are normal. The mediastinum is normal in width. There is multilevel degenerative disc disease of the thoracic spine. IMPRESSION: Hyperinflation which may reflect COPD. There is no active cardiopulmonary disease. Electronically Signed   By: David  Martinique M.D.   On: 07/11/2015 12:23     Hospital Course: Frances Morrison is a 74 y.o. who was admitted to [facility]. They were brought to the operating room on 07/25/2015 and underwent Procedure(s): TOTAL RIGHT KNEE ARTHROPLASTY.  Patient tolerated the procedure well and was later transferred to the recovery room and then to the orthopaedic floor for postoperative care.  They were given PO and IV analgesics for pain control following their surgery.  They were given 24 hours of postoperative antibiotics of  Anti-infectives    Start     Dose/Rate Route Frequency Ordered Stop   07/25/15 1430  ceFAZolin (ANCEF) IVPB 1 g/50 mL premix     1 g 100 mL/hr over 30 Minutes Intravenous Every 6 hours 07/25/15 1354 07/25/15 2159   07/25/15 0916  polymyxin B 500,000 Units, bacitracin 50,000 Units in sodium chloride irrigation 0.9 % 500 mL irrigation  Status:  Discontinued       As needed  07/25/15 0916 07/25/15 1052   07/25/15 0554  ceFAZolin (ANCEF) IVPB 2 g/50 mL premix     2 g 100 mL/hr over 30 Minutes Intravenous On call to O.R. 07/25/15 0554 07/25/15 0830     and started on DVT prophylaxis in the form of Xarelto.    PT and OT were ordered for total joint protocol.  Discharge planning consulted to help with postop disposition and equipment needs.  Patient had a good night on the evening of surgery.  They started to get up OOB with therapy on day one. Hemovac drain was pulled without difficulty.  Continued to work with therapy into day two.  She began to develop a rash, believed to be secondary to Xarelto. She was switched to aspirin for DVT prophylaxis.  Incision was healing well.  Patient was seen in rounds and was ready to go home.   Diet: Cardiac diet Activity:WBAT Follow-up:in 2 weeks Disposition - Home Discharged Condition: stable   Discharge Instructions    Call MD / Call 911    Complete by:  As directed   If you experience chest pain or shortness of breath, CALL 911 and be transported to the hospital emergency room.  If you develope a fever above 101 F, pus (white drainage) or increased drainage or redness at the wound, or calf pain, call your surgeon's office.     Constipation Prevention    Complete by:  As directed   Drink plenty of fluids.  Prune juice may be helpful.  You may use a stool softener, such as Colace (over the counter) 100 mg twice a day.  Use MiraLax (over the counter) for constipation as needed.     Diet - low sodium heart healthy    Complete by:  As directed      Discharge instructions    Complete by:  As directed   INSTRUCTIONS AFTER JOINT REPLACEMENT   Remove items at home which could result in a fall. This includes throw rugs or furniture in walking pathways ICE to the affected joint every three hours while awake for 30 minutes at a time, for at least the first 3-5 days, and then as needed for pain and swelling.  Continue to use ice for pain and swelling. You may notice swelling that will progress down to the foot and ankle.  This is normal after surgery.  Elevate your leg when you are not up walking on it.   Continue to use the breathing machine you got in the hospital  (incentive spirometer) which will help keep your temperature down.  It is common for your temperature to cycle up and down following surgery, especially at night when you are not up moving around and exerting yourself.  The breathing machine keeps your lungs expanded and your temperature down.   DIET:  As you were doing prior to hospitalization, we recommend a well-balanced diet.  DRESSING / WOUND CARE / SHOWERING  Keep the surgical dressing until follow up.  The dressing is water proof, so you can shower without any extra covering.  IF THE DRESSING FALLS OFF or the wound gets wet inside, change the dressing with sterile gauze.  Please use good hand washing techniques before changing the dressing.  Do not use any lotions or creams on the incision until instructed by your surgeon.    ACTIVITY  Increase activity slowly as tolerated, but follow the weight bearing instructions below.   No driving for 6 weeks or until further direction given by  your physician.  You cannot drive while taking narcotics.  No lifting or carrying greater than 10 lbs. until further directed by your surgeon. Avoid periods of inactivity such as sitting longer than an hour when not asleep. This helps prevent blood clots.  You may return to work once you are authorized by your doctor.     WEIGHT BEARING   Weight bearing as tolerated with assist device (walker, cane, etc) as directed, use it as long as suggested by your surgeon or therapist, typically at least 4-6 weeks.   EXERCISES  Results after joint replacement surgery are often greatly improved when you follow the exercise, range of motion and muscle strengthening exercises prescribed by your doctor. Safety measures are also important to protect the joint from further injury. Any time any of these exercises cause you to have increased pain or swelling, decrease what you are doing until you are comfortable again and then slowly increase them. If you have problems or  questions, call your caregiver or physical therapist for advice.   Rehabilitation is important following a joint replacement. After just a few days of immobilization, the muscles of the leg can become weakened and shrink (atrophy).  These exercises are designed to build up the tone and strength of the thigh and leg muscles and to improve motion. Often times heat used for twenty to thirty minutes before working out will loosen up your tissues and help with improving the range of motion but do not use heat for the first two weeks following surgery (sometimes heat can increase post-operative swelling).   These exercises can be done on a training (exercise) mat, on the floor, on a table or on a bed. Use whatever works the best and is most comfortable for you.    Use music or television while you are exercising so that the exercises are a pleasant break in your day. This will make your life better with the exercises acting as a break in your routine that you can look forward to.   Perform all exercises about fifteen times, three times per day or as directed.  You should exercise both the operative leg and the other leg as well.  Exercises include:   Quad Sets - Tighten up the muscle on the front of the thigh (Quad) and hold for 5-10 seconds.   Straight Leg Raises - With your knee straight (if you were given a brace, keep it on), lift the leg to 60 degrees, hold for 3 seconds, and slowly lower the leg.  Perform this exercise against resistance later as your leg gets stronger.  Leg Slides: Lying on your back, slowly slide your foot toward your buttocks, bending your knee up off the floor (only go as far as is comfortable). Then slowly slide your foot back down until your leg is flat on the floor again.  Angel Wings: Lying on your back spread your legs to the side as far apart as you can without causing discomfort.  Hamstring Strength:  Lying on your back, push your heel against the floor with your leg straight  by tightening up the muscles of your buttocks.  Repeat, but this time bend your knee to a comfortable angle, and push your heel against the floor.  You may put a pillow under the heel to make it more comfortable if necessary.   A rehabilitation program following joint replacement surgery can speed recovery and prevent re-injury in the future due to weakened muscles. Contact your doctor or a  physical therapist for more information on knee rehabilitation.    CONSTIPATION  Constipation is defined medically as fewer than three stools per week and severe constipation as less than one stool per week.  Even if you have a regular bowel pattern at home, your normal regimen is likely to be disrupted due to multiple reasons following surgery.  Combination of anesthesia, postoperative narcotics, change in appetite and fluid intake all can affect your bowels.   YOU MUST use at least one of the following options; they are listed in order of increasing strength to get the job done.  They are all available over the counter, and you may need to use some, POSSIBLY even all of these options:    Drink plenty of fluids (prune juice may be helpful) and high fiber foods Colace 100 mg by mouth twice a day  Senokot for constipation as directed and as needed Dulcolax (bisacodyl), take with full glass of water  Miralax (polyethylene glycol) once or twice a day as needed.  If you have tried all these things and are unable to have a bowel movement in the first 3-4 days after surgery call either your surgeon or your primary doctor.    If you experience loose stools or diarrhea, hold the medications until you stool forms back up.  If your symptoms do not get better within 1 week or if they get worse, check with your doctor.  If you experience "the worst abdominal pain ever" or develop nausea or vomiting, please contact the office immediately for further recommendations for treatment.   ITCHING:  If you experience itching with  your medications, try taking only a single pain pill, or even half a pain pill at a time.  You can also use Benadryl over the counter for itching or also to help with sleep.   TED HOSE STOCKINGS:  Use stockings on both legs until for at least 2 weeks or as directed by physician office. They may be removed at night for sleeping.  MEDICATIONS:  See your medication summary on the "After Visit Summary" that nursing will review with you.  You may have some home medications which will be placed on hold until you complete the course of blood thinner medication.  It is important for you to complete the blood thinner medication as prescribed.  PRECAUTIONS:  If you experience chest pain or shortness of breath - call 911 immediately for transfer to the hospital emergency department.   If you develop a fever greater that 101 F, purulent drainage from wound, increased redness or drainage from wound, foul odor from the wound/dressing, or calf pain - CONTACT YOUR SURGEON.                                                   FOLLOW-UP APPOINTMENTS:  If you do not already have a post-op appointment, please call the office for an appointment to be seen by your surgeon.  Guidelines for how soon to be seen are listed in your "After Visit Summary", but are typically between 1-4 weeks after surgery.  OTHER INSTRUCTIONS:   Do not take vitamins or supplements while taking blood thinner (Xarelto)  MAKE SURE YOU:  Understand these instructions.  Get help right away if you are not doing well or get worse.    Thank you for letting us be a  part of your medical care team.  It is a privilege we respect greatly.  We hope these instructions will help you stay on track for a fast and full recovery!     Increase activity slowly as tolerated    Complete by:  As directed             Medication List    STOP taking these medications        acetaminophen 500 MG tablet  Commonly known as:  TYLENOL     cephALEXin 250 MG  capsule  Commonly known as:  KEFLEX     multivitamin with minerals Tabs tablet     naproxen sodium 220 MG tablet  Commonly known as:  ANAPROX     ondansetron 4 MG tablet  Commonly known as:  ZOFRAN      TAKE these medications        amLODipine 10 MG tablet  Commonly known as:  NORVASC  Take 10 mg by mouth every morning.     aspirin EC 325 MG tablet  Take 1 tablet (325 mg total) by mouth 2 (two) times daily.     esomeprazole 40 MG capsule  Commonly known as:  NEXIUM  Take 40 mg by mouth daily before breakfast.     ferrous sulfate 325 (65 FE) MG tablet  Take 1 tablet (325 mg total) by mouth 2 (two) times daily with a meal.     HYDROcodone-acetaminophen 5-325 MG tablet  Commonly known as:  NORCO/VICODIN  Take 1-2 tablets by mouth every 4 (four) hours as needed (breakthrough pain).     methocarbamol 500 MG tablet  Commonly known as:  ROBAXIN  Take 1 tablet (500 mg total) by mouth every 6 (six) hours as needed for muscle spasms.           Follow-up Information    Follow up with [facility].   Why:  tub transfer bench   Contact information:   [address] [place] [address] [phone]       Follow up with [facility].   Why:  home health physical therapy   Contact information:   [place] [address] [phone]       Follow up with GIOFFRE,RONALD A, MD. Schedule an appointment as soon as possible for a visit in 2 weeks.   Specialty:  Orthopedic Surgery   Contact information:   [address] [place] [address] [phone]       Signed: Ardeen Jourdain, PA-C Orthopaedic Surgery 07/31/2015, 12:48 PM

## 2015-08-15 ENCOUNTER — Ambulatory Visit: Payer: Medicare HMO | Attending: Orthopedic Surgery | Admitting: Physical Therapy

## 2015-08-15 DIAGNOSIS — M25461 Effusion, right knee: Secondary | ICD-10-CM | POA: Diagnosis present

## 2015-08-15 DIAGNOSIS — M25661 Stiffness of right knee, not elsewhere classified: Secondary | ICD-10-CM | POA: Diagnosis present

## 2015-08-15 DIAGNOSIS — R269 Unspecified abnormalities of gait and mobility: Secondary | ICD-10-CM | POA: Insufficient documentation

## 2015-08-15 DIAGNOSIS — M25561 Pain in right knee: Secondary | ICD-10-CM

## 2015-08-15 NOTE — Therapy (Signed)
[facility] [place], [place], [address] Phone: [phone]   Fax:  [phone]  Physical Therapy Evaluation  Patient Details  Name: Frances Morrison MRN: [id number] Date of Birth: 1941/09/11 Referring Provider: Latanya Maudlin, MD  Encounter Date: 08/15/2015      PT End of Session - 08/15/15 0952    Visit Number 1   Number of Visits 12   Date for PT Re-Evaluation 09/26/15   PT Start Time 0956   PT Stop Time 1041   PT Time Calculation (min) 45 min   Activity Tolerance Patient tolerated treatment well   Behavior During Therapy [facility] for tasks assessed/performed      Past Medical History  Diagnosis Date  . Cervical cancer ([place])   . Hypertension   . PONV (postoperative nausea and vomiting)   . GERD (gastroesophageal reflux disease)   . Arthritis     arthritis -feet, hands knee    Past Surgical History  Procedure Laterality Date  . Abdominal hysterectomy    . Cholecystectomy    . Appendectomy    . Cataract extraction, bilateral Bilateral   . Tonsillectomy    . Total knee arthroplasty Right 07/25/2015    Procedure: TOTAL RIGHT KNEE ARTHROPLASTY;  Surgeon: Latanya Maudlin, MD;  Location: [facility];  Service: Orthopedics;  Laterality: Right;    There were no vitals filed for this visit.  Visit Diagnosis:  Stiffness of right knee - Plan: PT plan of care cert/re-cert  Right knee pain - Plan: PT plan of care cert/re-cert  Swelling of right knee joint - Plan: PT plan of care cert/re-cert  Abnormality of gait - Plan: PT plan of care cert/re-cert      Subjective Assessment - 08/15/15 0957    Subjective R TKA 07/25/15. Patient amb with SBQC in [place].   Pertinent History HTN   Patient Stated Goals I'd like it to be 100%   Currently in Pain? Yes   Pain Score 5    Pain Location Knee   Pain Orientation Right   Pain Descriptors / Indicators Sore   Pain Type Surgical pain   Pain Onset 1 to 4 weeks ago   Pain Frequency  Constant   Aggravating Factors  sit to stand after prolonged sitting   Pain Relieving Factors ice, meds   Effect of Pain on Daily Activities limited            [facility] PT Assessment - 08/15/15 0001    Assessment   Medical Diagnosis R TKA   Referring Provider Latanya Maudlin, MD   Onset Date/Surgical Date 07/25/15   Next MD Visit 09/03/15   Precautions   Precautions Knee   Precaution Comments TKA Protocol   Balance Screen   Has the patient fallen in the past 6 months No   Has the patient had a decrease in activity level because of a fear of falling?  No   Is the patient reluctant to leave their home because of a fear of falling?  No   Home Ecologist residence   Home Access Stairs to enter   Entrance Stairs-Number of Steps 1   Entrance Stairs-Rails None   Home Layout One level   Prior Function   Level of Independence Independent with household mobility with device   Vocation Retired   Observation/Other Assessments   Focus on Therapeutic Outcomes (FOTO)  61% limited   Observation/Other Assessments-Edema    Edema Circumferential;Figure 8  R 54 cm;  L 53.5 cm B ankle figure 8   Circumferential Edema   Circumferential - Right 48.5   Circumferential - Left  45   ROM / Strength   AROM / PROM / Strength AROM;PROM;Strength   AROM   AROM Assessment Site Knee   Right/Left Knee Right   Right Knee Extension -8   Right Knee Flexion 82   PROM   PROM Assessment Site Knee   Right/Left Knee Right   Right Knee Extension -5   Right Knee Flexion 95                   [facility] Adult PT Treatment/Exercise - 08/15/15 0001    Modalities   Modalities Electrical Stimulation;Vasopneumatic   Electrical Stimulation   Electrical Stimulation Location R knee   Electrical Stimulation Action IFC   Electrical Stimulation Parameters 1-10 Hz to tolerance x 15 min   Electrical Stimulation Goals Edema   Vasopneumatic   Number Minutes Vasopneumatic  15 minutes    Vasopnuematic Location  Knee   Vasopneumatic Pressure Medium   Vasopneumatic Temperature  3*                PT Education - 08/15/15 1035    Education provided Yes   Education Details Continue with HEP   Person(s) Educated Patient   Methods Explanation   Comprehension Verbalized understanding          PT Short Term Goals - 08/15/15 1221    PT SHORT TERM GOAL #1   Title I with initial HEP 08/29/15   Time 2   Period Weeks   Status New   PT SHORT TERM GOAL #2   Title able to ambulate safely without AD in home 08/29/15   Time 2   Period Weeks   Status New           PT Long Term Goals - 08/15/15 1222    PT LONG TERM GOAL #1   Title I with advanced HEP   Time 6   Period Weeks   Status New   PT LONG TERM GOAL #2   Title improved R knee ROM 0-120 degrees to improve function and gait   Time 6   Period Weeks   Status New   PT LONG TERM GOAL #3   Title amb with a normal gait pattern without AD   Time 6   Period Weeks   Status New   PT LONG TERM GOAL #4   Title decreased edema in R knee to within 0.5 cm of L knee   Time 6   Period Weeks   Status New   PT LONG TERM GOAL #5   Title able to perform ADLS with 3/22 pain or less   Time 6   Period Weeks   Status New               Plan - 08/15/15 1218    Clinical Impression Statement Patient had a R TKA on 07/25/15. She amb with a SBQC at home and RW in the community althought she had cane with her today. She has decreased ROM and strength as well as edema limiting ROM and altering gait pattern.   Pt will benefit from skilled therapeutic intervention in order to improve on the following deficits Abnormal gait;Decreased range of motion;Pain;Decreased scar mobility;Decreased strength;Increased edema   Rehab Potential Excellent   PT Frequency 2x / week   PT Duration 6 weeks   PT Treatment/Interventions ADLs/Self Care Home Management;Electrical Stimulation;Cryotherapy;Therapeutic  exercise;Manual  techniques;Vasopneumatic Device;Gait training;Patient/family education;Passive range of motion;Scar mobilization;Neuromuscular re-education   PT Next Visit Plan TKA protocol; modalities for edema/pain   Consulted and Agree with Plan of Care Patient          G-Codes - 09/14/2015 0951    Functional Assessment Tool Used FOTO = 61% limited   Functional Limitation Mobility: Walking and moving around   Mobility: Walking and Moving Around Current Status [phone]) At least 60 percent but less than 80 percent impaired, limited or restricted   Mobility: Walking and Moving Around Goal Status [phone]) At least 40 percent but less than 60 percent impaired, limited or restricted       Problem List Patient Active Problem List   Diagnosis Date Noted  . History of total knee arthroplasty 07/25/2015   [name] PT  09-14-2015, 12:28 PM  [facility] [address] [place], [place], [address] Phone: [phone]   Fax:  [phone]  Name: Frances Morrison MRN: [id number] Date of Birth: 1941-10-04

## 2015-08-21 ENCOUNTER — Ambulatory Visit: Payer: Medicare HMO | Admitting: Physical Therapy

## 2015-08-21 DIAGNOSIS — M25461 Effusion, right knee: Secondary | ICD-10-CM

## 2015-08-21 DIAGNOSIS — M25661 Stiffness of right knee, not elsewhere classified: Secondary | ICD-10-CM

## 2015-08-21 DIAGNOSIS — M25561 Pain in right knee: Secondary | ICD-10-CM

## 2015-08-21 NOTE — Therapy (Signed)
[facility] [place], [place], [address] Phone: [phone]   Fax:  [phone]  Physical Therapy Treatment  Patient Details  Name: Frances Morrison MRN: [id number] Date of Birth: 07-Feb-1941 Referring Provider: Latanya Maudlin, MD  Encounter Date: 08/21/2015      PT End of Session - 08/21/15 0820    Visit Number 2   Number of Visits 12   Date for PT Re-Evaluation 09/26/15   PT Start Time 0818   PT Stop Time 0915   PT Time Calculation (min) 57 min   Activity Tolerance Patient tolerated treatment well   Behavior During Therapy [facility] for tasks assessed/performed      Past Medical History  Diagnosis Date  . Cervical cancer ([place])   . Hypertension   . PONV (postoperative nausea and vomiting)   . GERD (gastroesophageal reflux disease)   . Arthritis     arthritis -feet, hands knee    Past Surgical History  Procedure Laterality Date  . Abdominal hysterectomy    . Cholecystectomy    . Appendectomy    . Cataract extraction, bilateral Bilateral   . Tonsillectomy    . Total knee arthroplasty Right 07/25/2015    Procedure: TOTAL RIGHT KNEE ARTHROPLASTY;  Surgeon: Latanya Maudlin, MD;  [facility];  Service: Orthopedics;  Laterality: Right;    There were no vitals filed for this visit.  Visit Diagnosis:  Stiffness of right knee  Right knee pain  Swelling of right knee joint      Subjective Assessment - 08/21/15 0821    Subjective "My knee is so sore and I can't get the swelling down even though I'm icing and elevating it."   Patient Stated Goals I'd like it to be 100%   Currently in Pain? Yes   Pain Score 3    Pain Location Knee   Pain Orientation Right   Pain Descriptors / Indicators Sore   Pain Type Surgical pain   Pain Onset More than a month ago   Pain Frequency Constant   Aggravating Factors  sit to stand after prolonged sitting   Pain Relieving Factors ice, meds   Effect of Pain on Daily Activities  limited            [facility] PT Assessment - 08/21/15 0001    ROM / Strength   AROM / PROM / Strength AROM;PROM   AROM   AROM Assessment Site Knee   Right/Left Knee Right   Right Knee Extension -3   Right Knee Flexion 98                     [facility] Adult PT Treatment/Exercise - 08/21/15 0001    Exercises   Exercises Knee/Hip   Knee/Hip Exercises: Aerobic   Nustep L3 x 10 with adjustments to seat for ROM   Knee/Hip Exercises: Standing   Terminal Knee Extension Strengthening;Right;20 reps   Theraband Level (Terminal Knee Extension) Level 2 (Red)   Lateral Step Up 1 set;Right;15 reps;Hand Hold: 2;Step Height: 4"  easy   Forward Step Up Right;1 set;15 reps;Hand Hold: 2;Step Height: 4"  easy   Rocker Board 3 minutes   Knee/Hip Exercises: Seated   Long Arc Quad Strengthening;Right;20 reps  5 sec hold,    Long Arc Quad Weight 3 lbs.   Knee/Hip Exercises: Supine   Straight Leg Raises Strengthening;Right;20 reps   Modalities   Modalities Designer, multimedia Location R knee  Electrical Stimulation Action IFC   Electrical Stimulation Parameters 1-10 Hz x 15 min to tolerance   Electrical Stimulation Goals Edema   Vasopneumatic   Number Minutes Vasopneumatic  15 minutes   Vasopnuematic Location  Knee   Vasopneumatic Pressure Medium   Vasopneumatic Temperature  3*                  PT Short Term Goals - 08/15/15 1221    PT SHORT TERM GOAL #1   Title I with initial HEP 08/29/15   Time 2   Period Weeks   Status New   PT SHORT TERM GOAL #2   Title able to ambulate safely without AD in home 08/29/15   Time 2   Period Weeks   Status New           PT Long Term Goals - 08/15/15 1222    PT LONG TERM GOAL #1   Title I with advanced HEP   Time 6   Period Weeks   Status New   PT LONG TERM GOAL #2   Title improved R knee ROM 0-120 degrees to improve function and gait   Time 6    Period Weeks   Status New   PT LONG TERM GOAL #3   Title amb with a normal gait pattern without AD   Time 6   Period Weeks   Status New   PT LONG TERM GOAL #4   Title decreased edema in R knee to within 0.5 cm of L knee   Time 6   Period Weeks   Status New   PT LONG TERM GOAL #5   Title able to perform ADLS with [id number] pain or less   Time 6   Period Weeks   Status New               Plan - 08/21/15 0900    Clinical Impression Statement Patient did an excellent job in therapy today. She tolerated all therex without increased pain and has already made improvements with AROM. Goals are ongoing.   Pt will benefit from skilled therapeutic intervention in order to improve on the following deficits Abnormal gait;Decreased range of motion;Pain;Decreased scar mobility;Decreased strength;Increased edema   Rehab Potential Excellent   PT Frequency 2x / week   PT Duration 6 weeks   PT Treatment/Interventions ADLs/Self Care Home Management;Electrical Stimulation;Cryotherapy;Therapeutic exercise;Manual techniques;Vasopneumatic Device;Gait training;Patient/family education;Passive range of motion;Scar mobilization;Neuromuscular re-education   PT Next Visit Plan Work on gait without AD, continue therex, ROM, modalities for edema/pain   Consulted and Agree with Plan of Care Patient        Problem List Patient Active Problem List   Diagnosis Date Noted  . History of total knee arthroplasty 07/25/2015    [name] PT  08/21/2015, 9:09 AM  [facility] [address] [place], [place], [address] Phone: [phone]   Fax:  [phone]  Name: Frances Morrison MRN: [id number] Date of Birth: 1941/02/22

## 2015-08-23 ENCOUNTER — Ambulatory Visit: Payer: Medicare HMO | Admitting: Physical Therapy

## 2015-08-23 DIAGNOSIS — M25661 Stiffness of right knee, not elsewhere classified: Secondary | ICD-10-CM

## 2015-08-23 DIAGNOSIS — M25461 Effusion, right knee: Secondary | ICD-10-CM

## 2015-08-23 DIAGNOSIS — M25561 Pain in right knee: Secondary | ICD-10-CM

## 2015-08-23 DIAGNOSIS — R269 Unspecified abnormalities of gait and mobility: Secondary | ICD-10-CM

## 2015-08-23 NOTE — Therapy (Signed)
[facility] [place], [place], [address] Phone: [phone]   Fax:  [phone]  Physical Therapy Treatment  Patient Details  Name: Frances Morrison MRN: [id number] Date of Birth: [birth year] Referring Provider: Latanya Maudlin, MD  Encounter Date: 08/23/2015      PT End of Session - 08/23/15 1117    Visit Number 3   Number of Visits 12   Date for PT Re-Evaluation 09/26/15   PT Start Time 1117   PT Stop Time 1212   PT Time Calculation (min) 55 min   Activity Tolerance Patient tolerated treatment well   Behavior During Therapy [facility] for tasks assessed/performed      Past Medical History  Diagnosis Date  . Cervical cancer ([place])   . Hypertension   . PONV (postoperative nausea and vomiting)   . GERD (gastroesophageal reflux disease)   . Arthritis     arthritis -feet, hands knee    Past Surgical History  Procedure Laterality Date  . Abdominal hysterectomy    . Cholecystectomy    . Appendectomy    . Cataract extraction, bilateral Bilateral   . Tonsillectomy    . Total knee arthroplasty Right 07/25/2015    Procedure: TOTAL RIGHT KNEE ARTHROPLASTY;  Surgeon: Latanya Maudlin, MD;  Location: [facility];  Service: Orthopedics;  Laterality: Right;    There were no vitals filed for this visit.  Visit Diagnosis:  Stiffness of right knee  Right knee pain  Swelling of right knee joint  Abnormality of gait      Subjective Assessment - 08/23/15 1120    Subjective My knee is just sore.   Patient Stated Goals I'd like it to be 100%   Currently in Pain? Yes   Pain Score 3    Pain Location Knee   Pain Orientation Right   Pain Descriptors / Indicators Sore   Pain Type Surgical pain   Pain Onset More than a month ago            [facility] PT Assessment - 08/23/15 0001    Assessment   Medical Diagnosis R TKA   Onset Date/Surgical Date 07/25/15   Next MD Visit 09/03/15   Precautions   Precaution Comments TKA Protocol   ROM  / Strength   AROM / PROM / Strength Strength   Strength   Overall Strength Deficits  R ankle inversion 4/5;   Strength Assessment Site Knee   Right/Left Knee Right   Right Knee Flexion --  5-/5   Right Knee Extension 5/5                     [facility] Adult PT Treatment/Exercise - 08/23/15 0001    Ambulation/Gait   Ambulation Distance (Feet) 100 Feet   Gait Pattern --  pronation R foot   Ambulation Surface Level   Gait velocity normal   Gait Comments slight decrease in stande time on R   Knee/Hip Exercises: Aerobic   Stationary Bike partial revolutions 1x 12 min   Knee/Hip Exercises: Standing   Heel Raises 3 sets;10 reps  0 UE support   Terminal Knee Extension Strengthening;Right;20 reps;3 sets;10 reps   Theraband Level (Terminal Knee Extension) Level 3 (Green)   Knee/Hip Exercises: Seated   Long Arc Quad Strengthening;Right;20 reps  5 sec hold,    Long Arc Quad Weight 3 lbs.   Modalities   Modalities Advice worker  Stimulation Action IFC   Electrical Stimulation Parameters 1-10 Hz to tolerance x 15 min   Electrical Stimulation Goals Edema   Vasopneumatic   Number Minutes Vasopneumatic  15 minutes   Vasopnuematic Location  Knee   Vasopneumatic Pressure Medium   Vasopneumatic Temperature  3*   Manual Therapy   Manual Therapy Edema management;Passive ROM   Edema Management retrograde massage from ankle to knee   Passive ROM R knee flexion                  PT Short Term Goals - 08/23/15 1207    PT SHORT TERM GOAL #1   Title I with initial HEP 08/29/15   Period Weeks   Status On-going   PT SHORT TERM GOAL #2   Title able to ambulate safely without AD in home 08/29/15   Time 2   Period Weeks   Status Achieved           PT Long Term Goals - 08/15/15 1222    PT LONG TERM GOAL #1   Title I with advanced HEP   Time 6   Period Weeks    Status New   PT LONG TERM GOAL #2   Title improved R knee ROM 0-120 degrees to improve function and gait   Time 6   Period Weeks   Status New   PT LONG TERM GOAL #3   Title amb with a normal gait pattern without AD   Time 6   Period Weeks   Status New   PT LONG TERM GOAL #4   Title decreased edema in R knee to within 0.5 cm of L knee   Time 6   Period Weeks   Status New   PT LONG TERM GOAL #5   Title able to perform ADLS with [id number] pain or less   Time 6   Period Weeks   Status New               Plan - 08/23/15 1204    Clinical Impression Statement Patient is doing really well. She can safely amb without AD on level surface. She does demonstrate foot pronation with gait on R and has weakness in ankle invertors. Unable to complete full revolution on bike at this time.   PT Next Visit Plan SLS activites for balance, continue bike for flexion, modalities for edema management; TKA protocol.        Problem List Patient Active Problem List   Diagnosis Date Noted  . History of total knee arthroplasty 07/25/2015    [name] PT  08/23/2015, 12:09 PM  [facility] [address] [place], [place], [address] Phone: [phone]   Fax:  [phone]  Name: Frances Morrison MRN: [id number] Date of Birth: 24-Oct-1940

## 2015-08-27 ENCOUNTER — Ambulatory Visit: Payer: Medicare HMO | Admitting: Physical Therapy

## 2015-08-27 DIAGNOSIS — M25561 Pain in right knee: Secondary | ICD-10-CM

## 2015-08-27 DIAGNOSIS — R269 Unspecified abnormalities of gait and mobility: Secondary | ICD-10-CM

## 2015-08-27 DIAGNOSIS — M25661 Stiffness of right knee, not elsewhere classified: Secondary | ICD-10-CM | POA: Diagnosis not present

## 2015-08-27 DIAGNOSIS — M25461 Effusion, right knee: Secondary | ICD-10-CM

## 2015-08-27 NOTE — Therapy (Signed)
[facility] [place], [place], [address] Phone: [phone]   Fax:  [phone]  Physical Therapy Treatment  Patient Details  Name: Frances Morrison MRN: [id number] Date of Birth: 1941-08-07 Referring Provider: Latanya Maudlin, MD  Encounter Date: 08/27/2015      PT End of Session - 08/27/15 0817    Visit Number 4   Number of Visits 12   Date for PT Re-Evaluation 09/26/15   PT Start Time 0815   PT Stop Time 0912   PT Time Calculation (min) 57 min   Activity Tolerance Patient tolerated treatment well   Behavior During Therapy [facility] for tasks assessed/performed      Past Medical History  Diagnosis Date  . Cervical cancer ([place])   . Hypertension   . PONV (postoperative nausea and vomiting)   . GERD (gastroesophageal reflux disease)   . Arthritis     arthritis -feet, hands knee    Past Surgical History  Procedure Laterality Date  . Abdominal hysterectomy    . Cholecystectomy    . Appendectomy    . Cataract extraction, bilateral Bilateral   . Tonsillectomy    . Total knee arthroplasty Right 07/25/2015    Procedure: TOTAL RIGHT KNEE ARTHROPLASTY;  Surgeon: Latanya Maudlin, MD;  Location: [facility];  Service: Orthopedics;  Laterality: Right;    There were no vitals filed for this visit.  Visit Diagnosis:  Stiffness of right knee  Right knee pain  Swelling of right knee joint  Abnormality of gait      Subjective Assessment - 08/27/15 0818    Subjective Knee was hurting a little more yesterday on medial side. "I did my exercises"   Patient Stated Goals I'd like it to be 100%   Currently in Pain? Yes   Pain Score 4    Pain Orientation Right   Pain Type Surgical pain   Pain Onset More than a month ago   Pain Frequency Constant   Aggravating Factors  sit to stand after prolonged sitting   Pain Relieving Factors ice, meds   Effect of Pain on Daily Activities limited            [facility] PT Assessment - 08/27/15  0001    ROM / Strength   AROM / PROM / Strength AROM   AROM   AROM Assessment Site Knee   Right/Left Knee Right   PROM   PROM Assessment Site Knee   Right/Left Knee Right                     [facility] Adult PT Treatment/Exercise - 08/27/15 0001    Knee/Hip Exercises: Aerobic   Stationary Bike partial revolutions x 10 min   Nustep L3 x 5 min seat 8   Knee/Hip Exercises: Standing   Heel Raises 20 reps   Forward Lunges 10 reps  then 3 x 30 sec hold   Lateral Step Up 20 reps;Hand Hold: 2;Step Height: 6"   Forward Step Up 20 reps;Hand Hold: 2;Step Height: 6"   Rocker Board 2 minutes   SLS multiple trials max hold   Modalities   Modalities Designer, multimedia Location R knee   Electrical Stimulation Action IFC   Electrical Stimulation Parameters 1-10 Hz to tolerance x 15 min   Electrical Stimulation Goals Edema   Vasopneumatic   Number Minutes Vasopneumatic  15 minutes   Vasopnuematic Location  Knee   Vasopneumatic Pressure Medium  Vasopneumatic Temperature  3*   Manual Therapy   Manual Therapy Passive ROM   Passive ROM R knee flexion with prolonged holds at end range                  PT Short Term Goals - 08/23/15 1207    PT SHORT TERM GOAL #1   Title I with initial HEP 08/29/15   Period Weeks   Status On-going   PT SHORT TERM GOAL #2   Title able to ambulate safely without AD in home 08/29/15   Time 2   Period Weeks   Status Achieved           PT Long Term Goals - 08/15/15 1222    PT LONG TERM GOAL #1   Title I with advanced HEP   Time 6   Period Weeks   Status New   PT LONG TERM GOAL #2   Title improved R knee ROM 0-120 degrees to improve function and gait   Time 6   Period Weeks   Status New   PT LONG TERM GOAL #3   Title amb with a normal gait pattern without AD   Time 6   Period Weeks   Status New   PT LONG TERM GOAL #4   Title decreased edema in R knee to  within 0.5 cm of L knee   Time 6   Period Weeks   Status New   PT LONG TERM GOAL #5   Title able to perform ADLS with [id number] pain or less   Time 6   Period Weeks   Status New               Plan - 08/27/15 [id number]    Clinical Impression Statement Patient continues to do well with therapy. She demonstrates some balance deficits with SLS on R.    PT Next Visit Plan Assess goals for MD note visit 09/03/15. SLS activites for balance, continue bike for flexion, modalities for edema management; TKA protocol.        Problem List Patient Active Problem List   Diagnosis Date Noted  . History of total knee arthroplasty 07/25/2015   [name] PT  08/27/2015, 9:57 AM  [facility] [address] [place], [place], [address] Phone: [phone]   Fax:  [phone]  Name: Frances Morrison MRN: [id number] Date of Birth: [birth year]

## 2015-08-29 ENCOUNTER — Ambulatory Visit: Payer: Medicare HMO | Admitting: Physical Therapy

## 2015-08-29 ENCOUNTER — Encounter: Payer: Self-pay | Admitting: Physical Therapy

## 2015-08-29 DIAGNOSIS — R269 Unspecified abnormalities of gait and mobility: Secondary | ICD-10-CM

## 2015-08-29 DIAGNOSIS — M25561 Pain in right knee: Secondary | ICD-10-CM

## 2015-08-29 DIAGNOSIS — M25461 Effusion, right knee: Secondary | ICD-10-CM

## 2015-08-29 DIAGNOSIS — M25661 Stiffness of right knee, not elsewhere classified: Secondary | ICD-10-CM | POA: Diagnosis not present

## 2015-08-29 NOTE — Therapy (Addendum)
[facility] [place], [place], [address] Phone: [phone]   Fax:  [phone]  Physical Therapy Treatment  Patient Details  Name: Frances Morrison MRN: [id number] Date of Birth: Sep 26, 1941 Referring Provider: Latanya Maudlin, MD  Encounter Date: 08/29/2015      PT End of Session - 08/29/15 0903    Visit Number 5   Number of Visits 12   Date for PT Re-Evaluation 09/26/15   PT Start Time 0902   PT Stop Time 0955   PT Time Calculation (min) 53 min   Activity Tolerance Patient tolerated treatment well   Behavior During Therapy [facility] for tasks assessed/performed      Past Medical History  Diagnosis Date  . Cervical cancer ([place])   . Hypertension   . PONV (postoperative nausea and vomiting)   . GERD (gastroesophageal reflux disease)   . Arthritis     arthritis -feet, hands knee    Past Surgical History  Procedure Laterality Date  . Abdominal hysterectomy    . Cholecystectomy    . Appendectomy    . Cataract extraction, bilateral Bilateral   . Tonsillectomy    . Total knee arthroplasty Right 07/25/2015    Procedure: TOTAL RIGHT KNEE ARTHROPLASTY;  Surgeon: Latanya Maudlin, MD;  Location: [facility];  Service: Orthopedics;  Laterality: Right;    There were no vitals filed for this visit.  Visit Diagnosis:  Stiffness of right knee  Right knee pain  Swelling of right knee joint  Abnormality of gait      Subjective Assessment - 08/29/15 0903    Subjective States that she did well with exercises yesterday and this morning her knee is stiff.   Pertinent History HTN   Patient Stated Goals I'd like it to be 100%   Currently in Pain? Yes   Pain Score 3    Pain Location Knee   Pain Orientation Right   Pain Descriptors / Indicators Sore   Pain Type Surgical pain   Pain Onset More than a month ago            [facility] PT Assessment - 08/29/15 0001    Assessment   Medical Diagnosis R TKA   Onset Date/Surgical Date  07/25/15   Next MD Visit 09/03/15   Precautions   Precaution Comments TKA Protocol   AROM   Right Knee Extension -8   Right Knee Flexion 114                     [facility] Adult PT Treatment/Exercise - 08/29/15 0001    Knee/Hip Exercises: Aerobic   Stationary Bike L0 x10 min   Knee/Hip Exercises: Standing   Lateral Step Up Right;3 sets;10 reps;Hand Hold: 2;Step Height: 6"   Forward Step Up Right;3 sets;10 reps;Hand Hold: 2;Step Height: 6"   Rocker Board 3 minutes   SLS 2 trials 1 UE support max hold on floor   Modalities   Modalities Designer, multimedia Location R knee   Electrical Stimulation Action IFC   Electrical Stimulation Parameters 1-10 Hz x15 min   Electrical Stimulation Goals Pain;Edema   Vasopneumatic   Number Minutes Vasopneumatic  15 minutes   Vasopnuematic Location  Knee   Vasopneumatic Pressure Medium   Vasopneumatic Temperature  61   Manual Therapy   Manual Therapy Passive ROM   Passive ROM PROM of R knee into flexion with gentle holds at end range  PT Short Term Goals - 08/29/15 0941    PT SHORT TERM GOAL #1   Title I with initial HEP 08/29/15   Period Weeks   Status Achieved   PT SHORT TERM GOAL #2   Title able to ambulate safely without AD in home 08/29/15   Time 2   Period Weeks   Status Achieved           PT Long Term Goals - 08/29/15 0941    PT LONG TERM GOAL #1   Title I with advanced HEP   Time 6   Period Weeks   Status On-going   PT LONG TERM GOAL #2   Title improved R knee ROM 0-120 degrees to improve function and gait   Time 6   Period Weeks   Status On-going  AROM R knee 8-114 deg 08/29/2015   PT LONG TERM GOAL #3   Title amb with a normal gait pattern without AD   Time 6   Period Weeks   Status On-going   PT LONG TERM GOAL #4   Title decreased edema in R knee to within 0.5 cm of L knee   Time 6   Period Weeks   Status  On-going  4 cm difference R>L 08/29/2015   PT LONG TERM GOAL #5   Title able to perform ADLS with [id number] pain or less   Time 6   Period Weeks   Status On-going  2-3/10 R knee pain as of 08/29/2015               Plan - 08/29/15 0942    Clinical Impression Statement Patient continues to do well during PT treatments with only reports of fatigue today and soreness upon start of today's treatment. AROM of R knee measured as 8-114 deg today in supine. 4 cm difference in edema between RLE and LLE in supine in the midpatellar region. Demonstrates good stability with UE support on floor with appropriate R ankle strategy. Has achieved all ST goals at this time and LT goals remain on-going secondary to decreased R knee ROM, gait pattern, increased edema and increased pain with ADLs. Normal modalities response noted following removal of the modalities. Denied R knee pain following today's treatment.   Pt will benefit from skilled therapeutic intervention in order to improve on the following deficits Abnormal gait;Decreased range of motion;Pain;Decreased scar mobility;Decreased strength;Increased edema   Rehab Potential Excellent   PT Frequency 2x / week   PT Duration 6 weeks   PT Treatment/Interventions ADLs/Self Care Home Management;Electrical Stimulation;Cryotherapy;Therapeutic exercise;Manual techniques;Vasopneumatic Device;Gait training;Patient/family education;Passive range of motion;Scar mobilization;Neuromuscular re-education   PT Next Visit Plan Continue per MPT POC with focus on ROM and reducing edema.   Consulted and Agree with Plan of Care Patient        Problem List Patient Active Problem List   Diagnosis Date Noted  . History of total knee arthroplasty 07/25/2015   Frances Morrison MPT [place], [place], [place] 08/29/2015, 2:28 PM  [facility] [address] [place], [place], [address] Phone: [phone]   Fax:  [phone]  Name: Frances Morrison MRN: [id number] Date of Birth: 17-Apr-1941

## 2015-09-05 ENCOUNTER — Ambulatory Visit: Payer: Medicare HMO | Admitting: Physical Therapy

## 2015-09-05 ENCOUNTER — Encounter: Payer: Self-pay | Admitting: Physical Therapy

## 2015-09-05 DIAGNOSIS — M25661 Stiffness of right knee, not elsewhere classified: Secondary | ICD-10-CM

## 2015-09-05 DIAGNOSIS — R269 Unspecified abnormalities of gait and mobility: Secondary | ICD-10-CM

## 2015-09-05 DIAGNOSIS — M25461 Effusion, right knee: Secondary | ICD-10-CM

## 2015-09-05 DIAGNOSIS — M25561 Pain in right knee: Secondary | ICD-10-CM

## 2015-09-05 NOTE — Therapy (Signed)
[facility] [place], [place], [address] Phone: [phone]   Fax:  [phone]  Physical Therapy Treatment  Patient Details  Name: Frances Morrison MRN: [id number] Date of Birth: May 12, 1941 Referring Provider: Latanya Maudlin, MD  Encounter Date: 09/05/2015      PT End of Session - 09/05/15 0823    Visit Number 6   Number of Visits 12   Date for PT Re-Evaluation 09/26/15   PT Start Time 0822   PT Stop Time 0907   PT Time Calculation (min) 45 min   Activity Tolerance Patient tolerated treatment well   Behavior During Therapy [facility] for tasks assessed/performed      Past Medical History  Diagnosis Date  . Cervical cancer ([place])   . Hypertension   . PONV (postoperative nausea and vomiting)   . GERD (gastroesophageal reflux disease)   . Arthritis     arthritis -feet, hands knee    Past Surgical History  Procedure Laterality Date  . Abdominal hysterectomy    . Cholecystectomy    . Appendectomy    . Cataract extraction, bilateral Bilateral   . Tonsillectomy    . Total knee arthroplasty Right 07/25/2015    Procedure: TOTAL RIGHT KNEE ARTHROPLASTY;  Surgeon: Latanya Maudlin, MD;  Location: [facility];  Service: Orthopedics;  Laterality: Right;    There were no vitals filed for this visit.  Visit Diagnosis:  Stiffness of right knee  Right knee pain  Swelling of right knee joint  Abnormality of gait      Subjective Assessment - 09/05/15 0823    Subjective Reports she has some soreness when she bends R knee.  States that MD instructed her to elevate R knee to reduce swelling. Reports that since surgery she has noticed that she feels sick to her stomach more.   Pertinent History HTN   Patient Stated Goals I'd like it to be 100%   Currently in Pain? No/denies            [facility] PT Assessment - 09/05/15 0001    Assessment   Medical Diagnosis R TKA   Onset Date/Surgical Date 07/25/15   Next MD Visit 11/2015    Precautions   Precaution Comments TKA Protocol                     [facility] Adult PT Treatment/Exercise - 09/05/15 0001    Knee/Hip Exercises: Aerobic   Stationary Bike L0 x10 min   Knee/Hip Exercises: Standing   Lateral Step Up Right;3 sets;10 reps;Hand Hold: 2;Step Height: 6"   Forward Step Up Right;3 sets;10 reps;Hand Hold: 2;Step Height: 6"   Knee/Hip Exercises: Seated   Long Arc Quad Strengthening;Right;3 sets;10 reps;Weights   Modalities   Modalities Designer, multimedia Location R knee   Electrical Stimulation Action IFC   Electrical Stimulation Parameters 1-10 Hz x15 min   Electrical Stimulation Goals Pain;Edema   Vasopneumatic   Number Minutes Vasopneumatic  15 minutes   Vasopnuematic Location  Knee   Vasopneumatic Pressure Medium   Vasopneumatic Temperature  54                  PT Short Term Goals - 08/29/15 0941    PT SHORT TERM GOAL #1   Title I with initial HEP 08/29/15   Period Weeks   Status Achieved   PT SHORT TERM GOAL #2   Title able to ambulate safely without AD in  home 08/29/15   Time 2   Period Weeks   Status Achieved           PT Long Term Goals - 08/29/15 0941    PT LONG TERM GOAL #1   Title I with advanced HEP   Time 6   Period Weeks   Status On-going   PT LONG TERM GOAL #2   Title improved R knee ROM 0-120 degrees to improve function and gait   Time 6   Period Weeks   Status On-going  AROM R knee 8-114 deg 08/29/2015   PT LONG TERM GOAL #3   Title amb with a normal gait pattern without AD   Time 6   Period Weeks   Status On-going   PT LONG TERM GOAL #4   Title decreased edema in R knee to within 0.5 cm of L knee   Time 6   Period Weeks   Status On-going  4 cm difference R>L 08/29/2015   PT LONG TERM GOAL #5   Title able to perform ADLS with [id number] pain or less   Time 6   Period Weeks   Status On-going  2-3/10 R knee pain as of 08/29/2015                Plan - 09/05/15 0854    Clinical Impression Statement Patient tolerated today's treatment well although she experienced L knee soreness today. Complained of fatigue mostly during today's treatment but did not have complaints of increased pain with increased weight on LAQ. Presented with minimal redness in the R mid incision region where scabs are still present in the incision. Also presents in clinic with edema imprints around R ankle although patient is wearing loose stretch diabetic socks. Had increased R lower leg inflammation down into R ankle area. L knee midpatellar edema measured as 41 cm, R midpatellar edema measured as 45.2 cm. Normal modalities response noted following removal of the modaliites. Denied R knee pain following today's treatment.   Pt will benefit from skilled therapeutic intervention in order to improve on the following deficits Abnormal gait;Decreased range of motion;Pain;Decreased scar mobility;Decreased strength;Increased edema   Rehab Potential Excellent   PT Frequency 2x / week   PT Duration 6 weeks   PT Treatment/Interventions ADLs/Self Care Home Management;Electrical Stimulation;Cryotherapy;Therapeutic exercise;Manual techniques;Vasopneumatic Device;Gait training;Patient/family education;Passive range of motion;Scar mobilization;Neuromuscular re-education   PT Next Visit Plan Continue per MPT POC with focus on ROM and reducing edema.   Consulted and Agree with Plan of Care Patient        Problem List Patient Active Problem List   Diagnosis Date Noted  . History of total knee arthroplasty 07/25/2015    Wynelle Fanny, PTA 09/05/2015, 10:41 AM  [facility] [address] [place], [place], [address] Phone: [phone]   Fax:  [phone]  Name: Frances Morrison MRN: [id number] Date of Birth: 26-Apr-1941

## 2015-09-07 ENCOUNTER — Ambulatory Visit: Payer: Medicare HMO | Attending: Orthopedic Surgery | Admitting: Physical Therapy

## 2015-09-07 DIAGNOSIS — M25461 Effusion, right knee: Secondary | ICD-10-CM | POA: Insufficient documentation

## 2015-09-07 DIAGNOSIS — R269 Unspecified abnormalities of gait and mobility: Secondary | ICD-10-CM | POA: Insufficient documentation

## 2015-09-07 DIAGNOSIS — M25661 Stiffness of right knee, not elsewhere classified: Secondary | ICD-10-CM | POA: Insufficient documentation

## 2015-09-07 DIAGNOSIS — M25561 Pain in right knee: Secondary | ICD-10-CM | POA: Diagnosis present

## 2015-09-07 NOTE — Therapy (Signed)
[facility] [place], [place], [address] Phone: [phone]   Fax:  [phone]  Physical Therapy Treatment  Patient Details  Name: Frances Morrison MRN: [id number] Date of Birth: [birth year] Referring Provider: Latanya Maudlin, MD  Encounter Date: 09/07/2015      PT End of Session - 09/07/15 0901    Visit Number 7   Number of Visits 12   Date for PT Re-Evaluation 09/26/15   PT Start Time 0900   PT Stop Time 1005   PT Time Calculation (min) 65 min   Activity Tolerance Patient tolerated treatment well   Behavior During Therapy [facility] for tasks assessed/performed      Past Medical History  Diagnosis Date  . Cervical cancer ([place])   . Hypertension   . PONV (postoperative nausea and vomiting)   . GERD (gastroesophageal reflux disease)   . Arthritis     arthritis -feet, hands knee    Past Surgical History  Procedure Laterality Date  . Abdominal hysterectomy    . Cholecystectomy    . Appendectomy    . Cataract extraction, bilateral Bilateral   . Tonsillectomy    . Total knee arthroplasty Right 07/25/2015    Procedure: TOTAL RIGHT KNEE ARTHROPLASTY;  Surgeon: Latanya Maudlin, MD;  Location: [facility];  Service: Orthopedics;  Laterality: Right;    There were no vitals filed for this visit.  Visit Diagnosis:  Stiffness of right knee  Swelling of right knee joint  Right knee pain  Abnormality of gait      Subjective Assessment - 09/07/15 0901    Subjective Patient reports she is just a little sore today in R knee.    Patient Stated Goals I'd like it to be 100%   Currently in Pain? Yes   Pain Score 3    Pain Location Knee   Pain Orientation Right   Pain Descriptors / Indicators Sore   Pain Type Surgical pain   Pain Onset More than a month ago   Pain Frequency Constant   Aggravating Factors  sit to stand after prolonged sitting   Pain Relieving Factors ice and elevating   Effect of Pain on Daily Activities limited             [facility] PT Assessment - 09/07/15 0001    Assessment   Medical Diagnosis R TKA   Onset Date/Surgical Date 07/25/15   Next MD Visit 11/2015   Circumferential Edema   Circumferential - Right 47.5 cm   ROM / Strength   AROM / PROM / Strength AROM;Strength   AROM   AROM Assessment Site Knee   Right/Left Knee Right   Right Knee Extension -7   Right Knee Flexion 106   PROM   Right Knee Extension -4   Right Knee Flexion 118                     [facility] Adult PT Treatment/Exercise - 09/07/15 0001    Knee/Hip Exercises: Stretches   Quad Stretch 2 reps;30 seconds  on wall using other foot to slide down   Other Knee/Hip Stretches knee flexion stretch off EOB using strap x 30 seconds   Knee/Hip Exercises: Aerobic   Stationary Bike L0 x 12 min moved up to seat 4 full revolutions   Knee/Hip Exercises: Standing   Lateral Step Up Right;3 sets;10 reps;Hand Hold: 2;Step Height: 6"   Forward Step Up Right;3 sets;10 reps;Hand Hold: 2;Step Height: 6"   SLS 2 min  multiple trials; 1 finger support to no support   Knee/Hip Exercises: Seated   Long Arc Quad Strengthening;Right;Weights  to fatigue with 5 second hold   Long Arc Quad Weight 5 lbs.   Health visitor IFC   Electrical Stimulation Parameters 1-10Hz  x 15 min to tolerance   Electrical Stimulation Goals Pain;Edema   Vasopneumatic   Number Minutes Vasopneumatic  15 minutes   Vasopnuematic Location  Knee   Vasopneumatic Pressure Medium   Vasopneumatic Temperature  34    Manual Therapy   Manual Therapy Soft tissue mobilization;Passive ROM   Soft tissue mobilization scar massage   Passive ROM passive stretch into flexion                  PT Short Term Goals - 08/29/15 0941    PT SHORT TERM GOAL #1   Title I with initial HEP 08/29/15   Period Weeks   Status Achieved   PT SHORT TERM GOAL #2   Title able to ambulate safely  without AD in home 08/29/15   Time 2   Period Weeks   Status Achieved           PT Long Term Goals - 09/07/15 0908    PT LONG TERM GOAL #1   Title I with advanced HEP   Time 6   Period Weeks   Status On-going   PT LONG TERM GOAL #2   Title improved R knee ROM 0-120 degrees to improve function and gait   Time 6   Period Weeks   Status On-going   PT LONG TERM GOAL #3   Title amb with a normal gait pattern without AD   Time 6   Period Weeks   Status On-going   PT LONG TERM GOAL #4   Title decreased edema in R knee to within 0.5 cm of L knee   Baseline 47.5 cm   Time 6   Period Weeks   Status On-going   PT LONG TERM GOAL #5   Title able to perform ADLS with [id number] pain or less   Time 6   Period Weeks   Status Achieved               Plan - 09/07/15 0959    Clinical Impression Statement Patient is progressing well with her goals meeting LTG #5 today. Her knee ext strength is 5/5 and flex 5-/5. Passively her ROM is progressing. She is still limited actively due to edema likely. R knee edema has decreased one cm from eval, but she still demos pitting edema along incision.   Pt will benefit from skilled therapeutic intervention in order to improve on the following deficits Abnormal gait;Decreased range of motion;Pain;Decreased scar mobility;Decreased strength;Increased edema   Rehab Potential Excellent   PT Frequency 2x / week   PT Duration 6 weeks   PT Treatment/Interventions ADLs/Self Care Home Management;Electrical Stimulation;Cryotherapy;Therapeutic exercise;Manual techniques;Vasopneumatic Device;Gait training;Patient/family education;Passive range of motion;Scar mobilization;Neuromuscular re-education   PT Next Visit Plan Continue per MPT POC with focus on ROM and reducing edema.   Consulted and Agree with Plan of Care Patient        Problem List Patient Active Problem List   Diagnosis Date Noted  . History of total knee arthroplasty 07/25/2015    [name] PT 09/07/2015, 12:19 PM  [facility] [address] [place], [place], [address] Phone: [phone]   Fax:  [phone]  Name: MERSADEZ MASTANDREA MRN: [id number] Date of Birth: 06-03-1941

## 2015-09-11 ENCOUNTER — Ambulatory Visit: Payer: Medicare HMO | Admitting: Physical Therapy

## 2015-09-11 DIAGNOSIS — M25661 Stiffness of right knee, not elsewhere classified: Secondary | ICD-10-CM

## 2015-09-11 DIAGNOSIS — M25461 Effusion, right knee: Secondary | ICD-10-CM

## 2015-09-11 DIAGNOSIS — M25561 Pain in right knee: Secondary | ICD-10-CM

## 2015-09-11 NOTE — Therapy (Signed)
[facility] [place], [place], [address] Phone: [phone]   Fax:  [phone]  Physical Therapy Treatment  Patient Details  Name: Frances Morrison MRN: [id number] Date of Birth: 11-20-1940 Referring Provider: Latanya Maudlin, MD  Encounter Date: 09/11/2015      PT End of Session - 09/11/15 0815    Visit Number 8   Number of Visits 12   Date for PT Re-Evaluation 09/26/15   PT Start Time 0815   PT Stop Time 0919   PT Time Calculation (min) 64 min   Activity Tolerance Patient tolerated treatment well   Behavior During Therapy [facility] for tasks assessed/performed      Past Medical History  Diagnosis Date  . Cervical cancer ([place])   . Hypertension   . PONV (postoperative nausea and vomiting)   . GERD (gastroesophageal reflux disease)   . Arthritis     arthritis -feet, hands knee    Past Surgical History  Procedure Laterality Date  . Abdominal hysterectomy    . Cholecystectomy    . Appendectomy    . Cataract extraction, bilateral Bilateral   . Tonsillectomy    . Total knee arthroplasty Right 07/25/2015    Procedure: TOTAL RIGHT KNEE ARTHROPLASTY;  Surgeon: Latanya Maudlin, MD;  Location: [facility];  Service: Orthopedics;  Laterality: Right;    There were no vitals filed for this visit.  Visit Diagnosis:  Stiffness of right knee  Swelling of right knee joint  Right knee pain      Subjective Assessment - 09/11/15 0815    Subjective Patient contiues to report stiffness and soreness.   Patient Stated Goals I'd like it to be 100%   Currently in Pain? Yes   Pain Score 2    Pain Location Knee   Pain Orientation Right   Pain Descriptors / Indicators Sore   Pain Type Surgical pain   Pain Onset More than a month ago   Pain Frequency Constant   Aggravating Factors  sit to stand after prolonged sitting   Pain Relieving Factors ice and elevating   Effect of Pain on Daily Activities limited            [facility] PT  Assessment - 09/11/15 0001    Assessment   Medical Diagnosis R TKA   Onset Date/Surgical Date 07/25/15   Next MD Visit 11/2015   Precautions   Precaution Comments TKA Protocol   ROM / Strength   AROM / PROM / Strength AROM;PROM   AROM   AROM Assessment Site Knee   Right/Left Knee Right   Right Knee Extension -5   Right Knee Flexion 112   PROM   PROM Assessment Site Knee   Right/Left Knee Right   Right Knee Extension -4   Right Knee Flexion 120                     [facility] Adult PT Treatment/Exercise - 09/11/15 0001    Knee/Hip Exercises: Aerobic   Stationary Bike L0 x 12 min moved from seat 4 to 2   Knee/Hip Exercises: Standing   Lateral Step Up Right;3 sets;10 reps;Hand Hold: 2;Step Height: 8"   Forward Step Up Right;3 sets;10 reps;Hand Hold: 2;Step Height: 8"   SLS 4 min multiple trials   Modalities   Modalities Electrical Stimulation;Vasopneumatic   Electrical Stimulation   Electrical Stimulation Location R knee   Electrical Stimulation Action IFC   Electrical Stimulation Parameters 1-10 HZ to tolerance x 15  min   Electrical Stimulation Goals Pain;Edema   Vasopneumatic   Number Minutes Vasopneumatic  15 minutes   Vasopnuematic Location  Knee   Vasopneumatic Pressure Medium   Vasopneumatic Temperature  44   Manual Therapy   Manual Therapy Passive ROM;Joint mobilization   Joint Mobilization for flexion and ext   Passive ROM for flex and ext                  PT Short Term Goals - 08/29/15 0941    PT SHORT TERM GOAL #1   Title I with initial HEP 08/29/15   Period Weeks   Status Achieved   PT SHORT TERM GOAL #2   Title able to ambulate safely without AD in home 08/29/15   Time 2   Period Weeks   Status Achieved           PT Long Term Goals - 09/11/15 1234    PT LONG TERM GOAL #1   Title I with advanced HEP   Time 6   Period Weeks   Status On-going   PT LONG TERM GOAL #2   Title improved R knee ROM 0-120 degrees to improve function  and gait   Time 6   Period Weeks   Status On-going   PT LONG TERM GOAL #3   Title amb with a normal gait pattern without AD   Time 6   Period Weeks   Status On-going               Plan - 09/11/15 1235    Clinical Impression Statement Patient demonstrated increased active and pasive ROM since last visit. She continues to have pitting edema in ankle, however knee edema was not pitting today and appeared less overall.    Pt will benefit from skilled therapeutic intervention in order to improve on the following deficits Abnormal gait;Decreased range of motion;Pain;Decreased scar mobility;Decreased strength;Increased edema   Rehab Potential Excellent   PT Frequency 2x / week   PT Duration 6 weeks   PT Treatment/Interventions ADLs/Self Care Home Management;Electrical Stimulation;Cryotherapy;Therapeutic exercise;Manual techniques;Vasopneumatic Device;Gait training;Patient/family education;Passive range of motion;Scar mobilization;Neuromuscular re-education   PT Next Visit Plan Add resistance on bike, continue with manual stretching for ROM, work TKE activities.   Consulted and Agree with Plan of Care Patient        Problem List Patient Active Problem List   Diagnosis Date Noted  . History of total knee arthroplasty 07/25/2015    [name] PT  09/11/2015, 1:11 PM  [facility] [address] [place], [place], [address] Phone: [phone]   Fax:  [phone]  Name: Frances Morrison MRN: [id number] Date of Birth: 08-12-1941

## 2015-09-13 ENCOUNTER — Encounter: Payer: Self-pay | Admitting: Physical Therapy

## 2015-09-13 ENCOUNTER — Ambulatory Visit: Payer: Medicare HMO | Admitting: Physical Therapy

## 2015-09-13 DIAGNOSIS — M25661 Stiffness of right knee, not elsewhere classified: Secondary | ICD-10-CM

## 2015-09-13 DIAGNOSIS — R269 Unspecified abnormalities of gait and mobility: Secondary | ICD-10-CM

## 2015-09-13 DIAGNOSIS — M25561 Pain in right knee: Secondary | ICD-10-CM

## 2015-09-13 DIAGNOSIS — M25461 Effusion, right knee: Secondary | ICD-10-CM

## 2015-09-13 NOTE — Therapy (Signed)
[facility] [place], [place], [address] Phone: [phone]   Fax:  [phone]  Physical Therapy Treatment  Patient Details  Name: Frances Morrison MRN: [id number] Date of Birth: 05-Nov-1940 Referring Provider: Latanya Maudlin, MD  Encounter Date: 09/13/2015      PT End of Session - 09/13/15 0817    Visit Number 9   Number of Visits 12   Date for PT Re-Evaluation 09/26/15   PT Start Time 0816   PT Stop Time 0912   PT Time Calculation (min) 56 min   Activity Tolerance Patient tolerated treatment well   Behavior During Therapy [facility] for tasks assessed/performed      Past Medical History  Diagnosis Date  . Cervical cancer ([place])   . Hypertension   . PONV (postoperative nausea and vomiting)   . GERD (gastroesophageal reflux disease)   . Arthritis     arthritis -feet, hands knee    Past Surgical History  Procedure Laterality Date  . Abdominal hysterectomy    . Cholecystectomy    . Appendectomy    . Cataract extraction, bilateral Bilateral   . Tonsillectomy    . Total knee arthroplasty Right 07/25/2015    Procedure: TOTAL RIGHT KNEE ARTHROPLASTY;  Surgeon: Latanya Maudlin, MD;  Location: [facility];  Service: Orthopedics;  Laterality: Right;    There were no vitals filed for this visit.  Visit Diagnosis:  Stiffness of right knee  Swelling of right knee joint  Right knee pain  Abnormality of gait      Subjective Assessment - 09/13/15 0816    Subjective Reports that R knee is "pretty sore" for unknown reason.   Pertinent History HTN   Patient Stated Goals I'd like it to be 100%   Currently in Pain? No/denies            [facility] PT Assessment - 09/13/15 0001    Assessment   Medical Diagnosis R TKA   Onset Date/Surgical Date 07/25/15   Next MD Visit 11/2015   Precautions   Precaution Comments TKA Protocol   ROM / Strength   AROM / PROM / Strength AROM   AROM   Overall AROM  Deficits;Within functional limits  for tasks performed   AROM Assessment Site Knee   Right/Left Knee Right   Right Knee Extension 5   Right Knee Flexion 120                     [facility] Adult PT Treatment/Exercise - 09/13/15 0001    Knee/Hip Exercises: Aerobic   Stationary Bike L0 x 12 min, seat 3   Knee/Hip Exercises: Standing   Lateral Step Up Right;3 sets;10 reps;Hand Hold: 2;Step Height: 8"   Forward Step Up Right;3 sets;10 reps;Hand Hold: 2;Step Height: 8"   Other Standing Knee Exercises R TKE Pink XTS x30 reps   Knee/Hip Exercises: Seated   Long Arc Quad Strengthening;Right;Weights;3 sets;10 reps   Long Arc Quad Weight 4 lbs.   Modalities   Modalities Science writer Action IFC   Electrical Stimulation Parameters 1-10 Hz x15 min   Electrical Stimulation Goals Pain;Edema   Vasopneumatic   Number Minutes Vasopneumatic  15 minutes   Vasopnuematic Location  Knee   Vasopneumatic Pressure Medium   Vasopneumatic Temperature  34   Manual Therapy   Manual Therapy Passive ROM   Passive ROM PROM of R knee into flex/ext  with gentle holds at end range                  PT Short Term Goals - 08/29/15 0941    PT SHORT TERM GOAL #1   Title I with initial HEP 08/29/15   Period Weeks   Status Achieved   PT SHORT TERM GOAL #2   Title able to ambulate safely without AD in home 08/29/15   Time 2   Period Weeks   Status Achieved           PT Long Term Goals - 09/13/15 0901    PT LONG TERM GOAL #1   Title I with advanced HEP   Time 6   Period Weeks   Status On-going   PT LONG TERM GOAL #2   Title improved R knee ROM 0-120 degrees to improve function and gait   Time 6   Period Weeks   Status On-going  AROM 5-120 deg 09/13/2015   PT LONG TERM GOAL #3   Title amb with a normal gait pattern without AD   Time 6   Period Weeks   Status Achieved   PT LONG TERM GOAL #4   Title  decreased edema in R knee to within 0.5 cm of L knee   Baseline 47.5 cm   Time 6   Period Weeks   Status On-going   PT LONG TERM GOAL #5   Title able to perform ADLS with [id number] pain or less   Time 6   Period Weeks   Status Achieved               Plan - 09/13/15 0910    Clinical Impression Statement Patient tolerated today's treatment well with no complaints of pain or fatigue today. Continues to present with pitting edema in the R ankle but pitting not present in R knee edema. Continues to progress with R knee AROM. R knee AROM measured as 5-120 deg today. Goals remain on-going secondary to ROM deficits and increased R knee edema. Normal modalities response noted following removal of the modalities.    Pt will benefit from skilled therapeutic intervention in order to improve on the following deficits Abnormal gait;Decreased range of motion;Pain;Decreased scar mobility;Decreased strength;Increased edema   Rehab Potential Excellent   PT Frequency 2x / week   PT Duration 6 weeks   PT Treatment/Interventions ADLs/Self Care Home Management;Electrical Stimulation;Cryotherapy;Therapeutic exercise;Manual techniques;Vasopneumatic Device;Gait training;Patient/family education;Passive range of motion;Scar mobilization;Neuromuscular re-education   PT Next Visit Plan Add resistance on bike, continue with manual stretching for ROM, work TKE activities.   Consulted and Agree with Plan of Care Patient        Problem List Patient Active Problem List   Diagnosis Date Noted  . History of total knee arthroplasty 07/25/2015    Wynelle Fanny, PTA 09/13/2015, 9:20 AM  [facility] [address] [place], [place], [address] Phone: [phone]   Fax:  [phone]  Name: Frances Morrison MRN: [id number] Date of Birth: 1941/06/11

## 2015-09-18 ENCOUNTER — Ambulatory Visit: Payer: Medicare HMO | Admitting: *Deleted

## 2015-09-18 ENCOUNTER — Encounter: Payer: Self-pay | Admitting: *Deleted

## 2015-09-18 DIAGNOSIS — M25661 Stiffness of right knee, not elsewhere classified: Secondary | ICD-10-CM

## 2015-09-18 DIAGNOSIS — M25561 Pain in right knee: Secondary | ICD-10-CM

## 2015-09-18 DIAGNOSIS — R269 Unspecified abnormalities of gait and mobility: Secondary | ICD-10-CM

## 2015-09-18 DIAGNOSIS — M25461 Effusion, right knee: Secondary | ICD-10-CM

## 2015-09-18 NOTE — Therapy (Signed)
[facility] [place], [place], [address] Phone: [phone]   Fax:  [phone]  Physical Therapy Treatment  Patient Details  Name: Frances Morrison MRN: [id number] Date of Birth: October 10, 1940 Referring Provider: Latanya Maudlin, MD  Encounter Date: 09/18/2015      PT End of Session - 09/18/15 0813    Visit Number 10   Number of Visits 12   Date for PT Re-Evaluation 09/26/15   PT Start Time 0815   PT Stop Time 0914   PT Time Calculation (min) 59 min      Past Medical History  Diagnosis Date  . Cervical cancer ([place])   . Hypertension   . PONV (postoperative nausea and vomiting)   . GERD (gastroesophageal reflux disease)   . Arthritis     arthritis -feet, hands knee    Past Surgical History  Procedure Laterality Date  . Abdominal hysterectomy    . Cholecystectomy    . Appendectomy    . Cataract extraction, bilateral Bilateral   . Tonsillectomy    . Total knee arthroplasty Right 07/25/2015    Procedure: TOTAL RIGHT KNEE ARTHROPLASTY;  Surgeon: Latanya Maudlin, MD;  Location: [facility];  Service: Orthopedics;  Laterality: Right;    There were no vitals filed for this visit.  Visit Diagnosis:  Stiffness of right knee  Swelling of right knee joint  Right knee pain  Abnormality of gait      Subjective Assessment - 09/18/15 0818    Subjective Reports that R knee is "pretty sore" for unknown reason. 2-3/10.  Dropped a piece of wood on my RT foot and it is bruised.    Pertinent History HTN   Patient Stated Goals I'd like it to be 100%   Currently in Pain? Yes   Pain Score 3    Pain Location Knee   Pain Orientation Right   Pain Descriptors / Indicators Sore   Pain Type Surgical pain   Pain Onset More than a month ago   Pain Frequency Constant   Aggravating Factors  sitting and standing too long   Pain Relieving Factors ice and elevating                         [facility] Adult PT Treatment/Exercise  - 09/18/15 0001    Exercises   Exercises Knee/Hip   Knee/Hip Exercises: Aerobic   Stationary Bike L1 x 8 min,   Nustep L4 x 9 min seat 6,5 for ROM progression   Knee/Hip Exercises: Standing   Lateral Step Up Right;3 sets;10 reps;Hand Hold: 2;Step Height: 8"   Forward Step Up Right;3 sets;10 reps;Hand Hold: 2;Step Height: 8"   Rocker Board 3 minutes   SLS 2 min multiple trials; 1 finger support to no support   Knee/Hip Exercises: Seated   Long Arc Quad Strengthening;Right;Weights;3 sets;10 reps   Long Arc Quad Weight 4 lbs.   Modalities   Modalities Designer, multimedia Location R knee  IFC x 15 mins 1-10hz    Electrical Stimulation Goals Pain;Edema   Vasopneumatic   Number Minutes Vasopneumatic  15 minutes   Vasopnuematic Location  Knee   Vasopneumatic Pressure Medium   Vasopneumatic Temperature  34   Manual Therapy   Manual Therapy Passive ROM   Passive ROM PROM of R knee into flex/ext with  holds at end range  PT Short Term Goals - 08/29/15 0941    PT SHORT TERM GOAL #1   Title I with initial HEP 08/29/15   Period Weeks   Status Achieved   PT SHORT TERM GOAL #2   Title able to ambulate safely without AD in home 08/29/15   Time 2   Period Weeks   Status Achieved           PT Long Term Goals - 09/13/15 0901    PT LONG TERM GOAL #1   Title I with advanced HEP   Time 6   Period Weeks   Status On-going   PT LONG TERM GOAL #2   Title improved R knee ROM 0-120 degrees to improve function and gait   Time 6   Period Weeks   Status On-going  AROM 5-120 deg 09/13/2015   PT LONG TERM GOAL #3   Title amb with a normal gait pattern without AD   Time 6   Period Weeks   Status Achieved   PT LONG TERM GOAL #4   Title decreased edema in R knee to within 0.5 cm of L knee   Baseline 47.5 cm   Time 6   Period Weeks   Status On-going   PT LONG TERM GOAL #5   Title able to  perform ADLS with [id number] pain or less   Time 6   Period Weeks   Status Achieved               Plan - 19-Sep-2015 [id number]    Clinical Impression Statement Pt did great with Rx today She had minimal pain increase with exs and act.'s. She scored a 38% limitation on FOTO today for Gcode. She did better with SLS and quad control with step ups. WE focused more on slowing down on ascending and descending for quad control. Goals are ongoing   Pt will benefit from skilled therapeutic intervention in order to improve on the following deficits Abnormal gait;Decreased range of motion;Pain;Decreased scar mobility;Decreased strength;Increased edema   Rehab Potential Excellent   PT Frequency 2x / week   PT Duration 6 weeks   PT Treatment/Interventions ADLs/Self Care Home Management;Electrical Stimulation;Cryotherapy;Therapeutic exercise;Manual techniques;Vasopneumatic Device;Gait training;Patient/family education;Passive range of motion;Scar mobilization;Neuromuscular re-education   PT Next Visit Plan Add resistance on bike, continue with manual stretching for ROM, work TKE activities.     10th visit FOTO Gcode done   [facility] and Agree with Plan of Care Patient          G-Codes - 09/19/15 0836    Functional Assessment Tool Used FOTO 10th visit 38% limitation      Problem List Patient Active Problem List   Diagnosis Date Noted  . History of total knee arthroplasty 07/25/2015    RAMSEUR,CHRIS, PTA 2015-09-19, 9:36 AM  [facility] [place], [place], [address] Phone: [phone]   Fax:  [phone]  Name: Frances Morrison MRN: [id number] Date of Birth: 10/06/1941

## 2015-09-20 ENCOUNTER — Encounter: Payer: Self-pay | Admitting: *Deleted

## 2015-09-20 ENCOUNTER — Ambulatory Visit: Payer: Medicare HMO | Admitting: *Deleted

## 2015-09-20 DIAGNOSIS — M25461 Effusion, right knee: Secondary | ICD-10-CM

## 2015-09-20 DIAGNOSIS — M25561 Pain in right knee: Secondary | ICD-10-CM

## 2015-09-20 DIAGNOSIS — M25661 Stiffness of right knee, not elsewhere classified: Secondary | ICD-10-CM | POA: Diagnosis not present

## 2015-09-20 DIAGNOSIS — R269 Unspecified abnormalities of gait and mobility: Secondary | ICD-10-CM

## 2015-09-20 NOTE — Therapy (Signed)
[facility] [place], [place], [address] Phone: [phone]   Fax:  [phone]  Physical Therapy Treatment  Patient Details  Name: Frances Morrison MRN: [id number] Date of Birth: 1941-07-11 Referring Provider: Latanya Maudlin, MD  Encounter Date: 09/20/2015      PT End of Session - 09/20/15 0822    Visit Number 11   Number of Visits 12   Date for PT Re-Evaluation 09/26/15   PT Start Time 0815   PT Stop Time 0914   PT Time Calculation (min) 59 min      Past Medical History  Diagnosis Date  . Cervical cancer ([place])   . Hypertension   . PONV (postoperative nausea and vomiting)   . GERD (gastroesophageal reflux disease)   . Arthritis     arthritis -feet, hands knee    Past Surgical History  Procedure Laterality Date  . Abdominal hysterectomy    . Cholecystectomy    . Appendectomy    . Cataract extraction, bilateral Bilateral   . Tonsillectomy    . Total knee arthroplasty Right 07/25/2015    Procedure: TOTAL RIGHT KNEE ARTHROPLASTY;  Surgeon: Latanya Maudlin, MD;  Location: [facility];  Service: Orthopedics;  Laterality: Right;    There were no vitals filed for this visit.  Visit Diagnosis:  Stiffness of right knee  Swelling of right knee joint  Right knee pain  Abnormality of gait      Subjective Assessment - 09/20/15 0818    Subjective Reports that R knee is "pretty sore" for unknown reason. 2-3/10.  Dropped a piece of wood on my RT foot and it is bruised.  Very sore yesterday   Patient Stated Goals I'd like it to be 100%   Currently in Pain? Yes   Pain Score 3    Pain Location Knee   Pain Orientation Right   Pain Descriptors / Indicators Sore   Pain Type Surgical pain   Pain Onset More than a month ago   Pain Frequency Constant            [facility] PT Assessment - 09/20/15 0001    AROM   Overall AROM  Deficits;Within functional limits for tasks performed   AROM Assessment Site Knee   Right/Left Knee  Right   Right Knee Extension -4   Right Knee Flexion 120   PROM   PROM Assessment Site Knee   Right/Left Knee Right   Right Knee Extension 0   Right Knee Flexion 120                     [facility] Adult PT Treatment/Exercise - 09/20/15 0001    Exercises   Exercises Knee/Hip   Knee/Hip Exercises: Aerobic   Nustep L4 x  28min seat 6,5 for ROM progression   Knee/Hip Exercises: Standing   Lateral Step Up Right;3 sets;10 reps;Hand Hold: 2;Step Height: 8"   Forward Step Up Right;3 sets;10 reps;Hand Hold: 2;Step Height: 8"   Rocker Board 3 minutes   Knee/Hip Exercises: Seated   Long Arc Quad Strengthening;Right;Weights;3 sets;10 reps   Long Arc Quad Weight 5 lbs.   Modalities   Modalities Designer, multimedia Location R knee  IFC x 15 mins 1-10hz    Electrical Stimulation Goals Pain;Edema   Vasopneumatic   Number Minutes Vasopneumatic  15 minutes   Vasopnuematic Location  Knee   Vasopneumatic Pressure Medium   Vasopneumatic Temperature  34   Manual  Therapy   Manual Therapy Passive ROM   Passive ROM PROM of R knee into flex/ext with  holds at end range with focus on ext                  PT Short Term Goals - 08/29/15 0941    PT SHORT TERM GOAL #1   Title I with initial HEP 08/29/15   Period Weeks   Status Achieved   PT SHORT TERM GOAL #2   Title able to ambulate safely without AD in home 08/29/15   Time 2   Period Weeks   Status Achieved           PT Long Term Goals - 09/13/15 0901    PT LONG TERM GOAL #1   Title I with advanced HEP   Time 6   Period Weeks   Status On-going   PT LONG TERM GOAL #2   Title improved R knee ROM 0-120 degrees to improve function and gait   Time 6   Period Weeks   Status On-going  AROM 5-120 deg 09/13/2015   PT LONG TERM GOAL #3   Title amb with a normal gait pattern without AD   Time 6   Period Weeks   Status Achieved   PT LONG TERM GOAL #4    Title decreased edema in R knee to within 0.5 cm of L knee   Baseline 47.5 cm   Time 6   Period Weeks   Status On-going   PT LONG TERM GOAL #5   Title able to perform ADLS with [id number] pain or less   Time 6   Period Weeks   Status Achieved               Plan - 09/20/15 0825    Clinical Impression Statement Pt did great with Rx today. She was able to perform all exs and act.'s for RT knee with minimal pain. She also progressed with PROM 0-120 degrees. Pt is progressing toward all goals and should meet all goals in the next few weeks and wants to join [employer] program.   Pt will benefit from skilled therapeutic intervention in order to improve on the following deficits Abnormal gait;Decreased range of motion;Pain;Decreased scar mobility;Decreased strength;Increased edema   Rehab Potential Excellent   PT Frequency 2x / week   PT Duration 6 weeks   PT Treatment/Interventions ADLs/Self Care Home Management;Electrical Stimulation;Cryotherapy;Therapeutic exercise;Manual techniques;Vasopneumatic Device;Gait training;Patient/family education;Passive range of motion;Scar mobilization;Neuromuscular re-education   PT Next Visit Plan Add resistance on bike, continue with manual stretching for ROM, work TKE activities.     10th visit FOTO Gcode done   [facility] REcert due    Consulted and Agree with Plan of Care Patient        Problem List Patient Active Problem List   Diagnosis Date Noted  . History of total knee arthroplasty 07/25/2015    Roni Scow,CHRIS, PTA 09/20/2015, 9:27 AM  [facility] [place], [place], [address] Phone: [phone]   Fax:  [phone]  Name: Frances Morrison MRN: [id number] Date of Birth: [birth year]

## 2015-09-22 NOTE — Addendum Note (Signed)
Addended by: Aliceson Dolbow, [place] W on: 09/22/2015 05:06 PM   Modules accepted: Orders

## 2015-09-24 ENCOUNTER — Ambulatory Visit: Payer: Medicare HMO | Admitting: Physical Therapy

## 2015-09-24 DIAGNOSIS — M25461 Effusion, right knee: Secondary | ICD-10-CM

## 2015-09-24 DIAGNOSIS — M25561 Pain in right knee: Secondary | ICD-10-CM

## 2015-09-24 DIAGNOSIS — M25661 Stiffness of right knee, not elsewhere classified: Secondary | ICD-10-CM

## 2015-09-24 NOTE — Therapy (Signed)
[facility] [address] [place], [place], [address] Phone: [phone]   Fax:  [phone]  Physical Therapy Treatment  Patient Details  Name: Frances Morrison MRN: [id number] Date of Birth: 01/26/1941 Referring Provider: Ronald Gioffre, MD  Encounter Date: 09/24/2015      PT End of Session - 09/24/15 0814    Visit Number 12   Number of Visits 16   Date for PT Re-Evaluation 10/15/15   PT Start Time 0814   PT Stop Time 0920   PT Time Calculation (min) 66 min   Activity Tolerance Patient tolerated treatment well   Behavior During Therapy WFL for tasks assessed/performed      Past Medical History  Diagnosis Date  . Cervical cancer ([facility])   . Hypertension   . PONV (postoperative nausea and vomiting)   . GERD (gastroesophageal reflux disease)   . Arthritis     arthritis -feet, hands knee    Past Surgical History  Procedure Laterality Date  . Abdominal hysterectomy    . Cholecystectomy    . Appendectomy    . Cataract extraction, bilateral Bilateral   . Tonsillectomy    . Total knee arthroplasty Right 07/25/2015    Procedure: TOTAL RIGHT KNEE ARTHROPLASTY;  Surgeon: Ronald Gioffre, MD;  Location: [facility];  Service: Orthopedics;  Laterality: Right;    There were no vitals filed for this visit.  Visit Diagnosis:  Stiffness of right knee - Plan: PT plan of care cert/re-cert  Swelling of right knee joint - Plan: PT plan of care cert/re-cert  Right knee pain - Plan: PT plan of care cert/re-cert      Subjective Assessment - 09/24/15 0819    Subjective Patient reports that knee is just sore. She complains more of arthritis in her feet which bothers her in the morning.    Pertinent History HTN   Patient Stated Goals I'd like it to be 100%   Currently in Pain? Yes   Pain Score 2    Pain Location Knee   Pain Orientation Right   Pain Descriptors / Indicators Sore   Pain Type Surgical pain   Pain Onset More than a month ago    Pain Frequency Constant   Aggravating Factors  sitting or standing too long, too much exercise   Pain Relieving Factors rest, ice, elevation   Effect of Pain on Daily Activities limited            [facility] PT Assessment - 09/24/15 0001    Assessment   Medical Diagnosis R TKA   Onset Date/Surgical Date 07/25/15   Next MD Visit 11/2015   Precautions   Precaution Comments TKA Protocol   Circumferential Edema   Circumferential - Right 45.5 cm   ROM / Strength   AROM / PROM / Strength AROM;PROM;Strength   AROM   AROM Assessment Site Knee   Right/Left Knee Right   Right Knee Extension -3   Right Knee Flexion 115   PROM   PROM Assessment Site Knee   Right/Left Knee Right   Right Knee Extension 0   Right Knee Flexion 120   Strength   Strength Assessment Site Knee   Right/Left Knee Right   Right Knee Extension 5/5                     [facility] Adult PT Treatment/Exercise - 09/24/15 0001    Knee/Hip Exercises: Aerobic   Nustep L5 x  14min seat 4 for ROM progression     Knee/Hip Exercises: Machines for Strengthening   Cybex Knee Extension 2 plates 2x 10   Cybex Knee Flexion 3 plates 3 x 10   Cybex Leg Press 1.5 plates 3x10   Modalities   Modalities Electrical Stimulation;Vasopneumatic   Electrical Stimulation   Electrical Stimulation Location R knee  IFC x 15 mins 1-10hz   Electrical Stimulation Goals Pain;Edema   Vasopneumatic   Number Minutes Vasopneumatic  15 minutes   Vasopnuematic Location  Knee   Vasopneumatic Pressure Medium   Vasopneumatic Temperature  34   Manual Therapy   Manual Therapy Passive ROM   Passive ROM PROM for flexion with scar mobilization                  PT Short Term Goals - 08/29/15 0941    PT SHORT TERM GOAL #1   Title I with initial HEP 08/29/15   Period Weeks   Status Achieved   PT SHORT TERM GOAL #2   Title able to ambulate safely without AD in home 08/29/15   Time 2   Period Weeks   Status Achieved            PT Long Term Goals - 09/24/15 9675    PT LONG TERM GOAL #1   Title I with advanced HEP   Time 6   Period Weeks   Status Achieved   PT LONG TERM GOAL #2   Title improved R knee ROM 0-120 degrees to improve function and gait   Time 6   Period Weeks   Status On-going   PT LONG TERM GOAL #3   Title amb with a normal gait pattern without AD   Time 6   Period Weeks   Status Achieved   PT LONG TERM GOAL #4   Title decreased edema in R knee to within 0.5 cm of L knee   Baseline 45.5  cm   Time 6   Period Weeks   Status Achieved   PT LONG TERM GOAL #5   Title able to perform ADLS with 9/16 pain or less   Time 6   Period Weeks   Status Achieved               Plan - 09/24/15 1311    Clinical Impression Statement Patient is progressing well. She has not met her ROM goals, but is close. She has met her goal for decreasing edema although some remains likely limiting active ROM. She had marked soreness in her R ITB today which responded well MFR, but did not change knee flexion. She also did well with strenghthening on machines with no increase in pain reported.   Pt will benefit from skilled therapeutic intervention in order to improve on the following deficits Abnormal gait;Decreased range of motion;Pain;Decreased scar mobility;Decreased strength;Increased edema   Rehab Potential Excellent   PT Frequency 2x / week   PT Duration 3 weeks   PT Treatment/Interventions ADLs/Self Care Home Management;Electrical Stimulation;Cryotherapy;Therapeutic exercise;Manual techniques;Vasopneumatic Device;Gait training;Patient/family education;Passive range of motion;Scar mobilization;Neuromuscular re-education   PT Next Visit Plan Continue 4 more visits to reach ROM goals. Continue machines for strength. Recert sent to MD 3/84/66.   Consulted and Agree with Plan of Care Patient        Problem List Patient Active Problem List   Diagnosis Date Noted  . History of total knee arthroplasty  07/25/2015   [name] PT  09/24/2015, 4:22 PM  [facility] [address] [place], [place], [address]  Phone: [phone]   Fax:  [phone]  Name: Frances Morrison MRN: [id number] Date of Birth: 05/14/1941     

## 2015-09-26 ENCOUNTER — Ambulatory Visit: Payer: Medicare HMO | Admitting: Physical Therapy

## 2015-09-26 ENCOUNTER — Encounter: Payer: Self-pay | Admitting: Physical Therapy

## 2015-09-26 DIAGNOSIS — M25461 Effusion, right knee: Secondary | ICD-10-CM

## 2015-09-26 DIAGNOSIS — M25661 Stiffness of right knee, not elsewhere classified: Secondary | ICD-10-CM

## 2015-09-26 DIAGNOSIS — M25561 Pain in right knee: Secondary | ICD-10-CM

## 2015-09-26 DIAGNOSIS — R269 Unspecified abnormalities of gait and mobility: Secondary | ICD-10-CM

## 2015-09-26 NOTE — Therapy (Signed)
[facility] [place], [place], [address] Phone: [phone]   Fax:  [phone]  Physical Therapy Treatment  Patient Details  Name: Frances Morrison MRN: [id number] Date of Birth: 1940/12/15 Referring Provider: Latanya Maudlin, MD  Encounter Date: 09/26/2015      PT End of Session - 09/26/15 0820    Visit Number 13   Number of Visits 16   Date for PT Re-Evaluation 10/15/15   PT Start Time 0816   PT Stop Time 0917   PT Time Calculation (min) 61 min   Activity Tolerance Patient tolerated treatment well   Behavior During Therapy [facility] for tasks assessed/performed      Past Medical History  Diagnosis Date  . Cervical cancer ([place])   . Hypertension   . PONV (postoperative nausea and vomiting)   . GERD (gastroesophageal reflux disease)   . Arthritis     arthritis -feet, hands knee    Past Surgical History  Procedure Laterality Date  . Abdominal hysterectomy    . Cholecystectomy    . Appendectomy    . Cataract extraction, bilateral Bilateral   . Tonsillectomy    . Total knee arthroplasty Right 07/25/2015    Procedure: TOTAL RIGHT KNEE ARTHROPLASTY;  Surgeon: Latanya Maudlin, MD;  Location: [facility];  Service: Orthopedics;  Laterality: Right;    There were no vitals filed for this visit.  Visit Diagnosis:  Stiffness of right knee  Swelling of right knee joint  Right knee pain  Abnormality of gait      Subjective Assessment - 09/26/15 0820    Subjective Patient reports that her knee is sore.   Pertinent History HTN   Patient Stated Goals I'd like it to be 100%   Currently in Pain? No/denies            [facility] PT Assessment - 09/26/15 0001    Assessment   Medical Diagnosis R TKA   Onset Date/Surgical Date 07/25/15   Next MD Visit 11/2015   Precautions   Precaution Comments TKA Protocol   ROM / Strength   AROM / PROM / Strength AROM   AROM   Overall AROM  Deficits;Within functional limits for tasks  performed   AROM Assessment Site Knee   Right/Left Knee Right   Right Knee Extension 0   Right Knee Flexion 115                     [facility] Adult PT Treatment/Exercise - 09/26/15 0001    Knee/Hip Exercises: Stretches   Active Hamstring Stretch Right;3 reps;30 seconds   Knee/Hip Exercises: Aerobic   Nustep L6 x12 min   Knee/Hip Exercises: Machines for Strengthening   Cybex Knee Extension 2 plates 2x 10   Cybex Knee Flexion 3 pl x5 reps; 2x10 reps 4 pl   Cybex Leg Press 1.5 plates 3x10   Knee/Hip Exercises: Standing   Forward Lunges Right;2 sets;10 reps;3 seconds  12" step   Rocker Board 3 minutes   Modalities   Modalities Designer, multimedia Location R knee   Electrical Stimulation Action IFC   Electrical Stimulation Parameters 1-10 Hz x15 min   Electrical Stimulation Goals Edema   Vasopneumatic   Number Minutes Vasopneumatic  15 minutes   Vasopnuematic Location  Knee   Vasopneumatic Pressure Medium   Vasopneumatic Temperature  34   Manual Therapy   Manual Therapy Soft tissue mobilization   Soft tissue mobilization R  knee incision scar massage to promote proper scar mobility in supine                  PT Short Term Goals - 08/29/15 0941    PT SHORT TERM GOAL #1   Title I with initial HEP 08/29/15   Period Weeks   Status Achieved   PT SHORT TERM GOAL #2   Title able to ambulate safely without AD in home 08/29/15   Time 2   Period Weeks   Status Achieved           PT Long Term Goals - 09/26/15 0910    PT LONG TERM GOAL #1   Title I with advanced HEP   Time 6   Period Weeks   Status Achieved   PT LONG TERM GOAL #2   Title improved R knee ROM 0-120 degrees to improve function and gait   Time 6   Period Weeks   Status Partially Met  AROM R knee 0-115 deg 09/26/2015   PT LONG TERM GOAL #3   Title amb with a normal gait pattern without AD   Time 6   Period Weeks    Status Achieved   PT LONG TERM GOAL #4   Title decreased edema in R knee to within 0.5 cm of L knee   Baseline 45.5  cm   Time 6   Period Weeks   Status Achieved   PT LONG TERM GOAL #5   Title able to perform ADLS with 1/24 pain or less   Time 6   Period Weeks   Status Achieved               Plan - 09/26/15 0915    Clinical Impression Statement Patient is progressing well regarding strengthening machines and has no complaint of pain during machine strengthening. Experiences tightness in inferior R knee per patient report to which she thinks is swelling. HS stretches and lunges were conducted in efforts of increasing AROM of the R knee. AROM of the R knee was measured as 0-115 deg today in supine. Continues to have pitting edema which is most evident in R ankle. Normal modalities response noted following removal of the modalities. Denied R knee pain following today's treatment,   Pt will benefit from skilled therapeutic intervention in order to improve on the following deficits Abnormal gait;Decreased range of motion;Pain;Decreased scar mobility;Decreased strength;Increased edema   Rehab Potential Excellent   PT Frequency 2x / week   PT Duration 3 weeks   PT Treatment/Interventions ADLs/Self Care Home Management;Electrical Stimulation;Cryotherapy;Therapeutic exercise;Manual techniques;Vasopneumatic Device;Gait training;Patient/family education;Passive range of motion;Scar mobilization;Neuromuscular re-education   PT Next Visit Plan Continue progressing ROM and strength for the next 3 treatments.   Consulted and Agree with Plan of Care Patient        Problem List Patient Active Problem List   Diagnosis Date Noted  . History of total knee arthroplasty 07/25/2015    Wynelle Fanny, PTA 09/26/2015, 9:26 AM  [facility] [address] [place], [place], [address] Phone: [phone]   Fax:  [phone]  Name: Frances Morrison MRN:  [id number] Date of Birth: 12-01-1940

## 2015-10-03 ENCOUNTER — Encounter: Payer: Self-pay | Admitting: *Deleted

## 2015-10-03 ENCOUNTER — Ambulatory Visit: Payer: Medicare HMO | Admitting: *Deleted

## 2015-10-03 DIAGNOSIS — M25661 Stiffness of right knee, not elsewhere classified: Secondary | ICD-10-CM | POA: Diagnosis not present

## 2015-10-03 DIAGNOSIS — M25561 Pain in right knee: Secondary | ICD-10-CM

## 2015-10-03 DIAGNOSIS — M25461 Effusion, right knee: Secondary | ICD-10-CM

## 2015-10-03 DIAGNOSIS — R269 Unspecified abnormalities of gait and mobility: Secondary | ICD-10-CM

## 2015-10-03 NOTE — Therapy (Signed)
[facility] [place], [place], [address] Phone: [phone]   Fax:  [phone]  Physical Therapy Treatment  Patient Details  Name: Frances Morrison MRN: [id number] Date of Birth: [birth year] Referring Provider: Latanya Maudlin, MD  Encounter Date: 10/03/2015      PT End of Session - 10/03/15 0953    Visit Number 14   Number of Visits 16   Date for PT Re-Evaluation 10/15/15   PT Start Time 0900   PT Stop Time 0959   PT Time Calculation (min) 59 min      Past Medical History  Diagnosis Date  . Cervical cancer ([place])   . Hypertension   . PONV (postoperative nausea and vomiting)   . GERD (gastroesophageal reflux disease)   . Arthritis     arthritis -feet, hands knee    Past Surgical History  Procedure Laterality Date  . Abdominal hysterectomy    . Cholecystectomy    . Appendectomy    . Cataract extraction, bilateral Bilateral   . Tonsillectomy    . Total knee arthroplasty Right 07/25/2015    Procedure: TOTAL RIGHT KNEE ARTHROPLASTY;  Surgeon: Latanya Maudlin, MD;  Location: [facility];  Service: Orthopedics;  Laterality: Right;    There were no vitals filed for this visit.  Visit Diagnosis:  Stiffness of right knee  Swelling of right knee joint  Right knee pain  Abnormality of gait      Subjective Assessment - 10/03/15 0901    Subjective Patient reports that her knee is sore.   Pertinent History HTN   Patient Stated Goals I'd like it to be 100%   Currently in Pain? No/denies   Pain Descriptors / Indicators Sore   Pain Onset More than a month ago   Aggravating Factors  too long in one position   Pain Relieving Factors rest, ice                         [facility] Adult PT Treatment/Exercise - 10/03/15 0001    Exercises   Exercises Knee/Hip   Knee/Hip Exercises: Aerobic   Nustep L5 x12 min   Knee/Hip Exercises: Machines for Strengthening   Cybex Knee Extension 2 plates 2x 10   Cybex Knee  Flexion 3 pl x5 reps; 2x10 reps 4 pl   Cybex Leg Press 1.5 plates 3x10   Knee/Hip Exercises: Standing   Forward Lunges Right;2 sets;10 reps;3 seconds  12" step   Rocker Board 3 minutes   Modalities   Modalities Electrical Stimulation;Vasopneumatic   Electrical Stimulation   Electrical Stimulation Location R knee  IFC x 15 mins 1-_0    Electrical Stimulation Goals Edema   Vasopneumatic   Number Minutes Vasopneumatic  15 minutes   Vasopnuematic Location  Knee   Vasopneumatic Pressure Medium   Vasopneumatic Temperature  34   Manual Therapy   Passive ROM PROM for flexion with scar mobilization                  PT Short Term Goals - 08/29/15 0941    PT SHORT TERM GOAL #1   Title I with initial HEP 08/29/15   Period Weeks   Status Achieved   PT SHORT TERM GOAL #2   Title able to ambulate safely without AD in home 08/29/15   Time 2   Period Weeks   Status Achieved           PT Long Term Goals - 09/26/15 7672  PT LONG TERM GOAL #1   Title I with advanced HEP   Time 6   Period Weeks   Status Achieved   PT LONG TERM GOAL #2   Title improved R knee ROM 0-120 degrees to improve function and gait   Time 6   Period Weeks   Status Partially Met  AROM R knee 0-115 deg 09/26/2015   PT LONG TERM GOAL #3   Title amb with a normal gait pattern without AD   Time 6   Period Weeks   Status Achieved   PT LONG TERM GOAL #4   Title decreased edema in R knee to within 0.5 cm of L knee   Baseline 45.5  cm   Time 6   Period Weeks   Status Achieved   PT LONG TERM GOAL #5   Title able to perform ADLS with 3/79 pain or less   Time 6   Period Weeks   Status Achieved               Plan - 10/03/15 0954    Clinical Impression Statement Pt. did very well with Rx today. She continues to progress with strengthening and ROM for RT knee with minimal. She had 0-120 degrees PROM. She was able to meet all goals except AROM for RT knee, but should meet soon   Pt will  benefit from skilled therapeutic intervention in order to improve on the following deficits Abnormal gait;Decreased range of motion;Pain;Decreased scar mobility;Decreased strength;Increased edema   Rehab Potential Excellent   PT Frequency 2x / week   PT Duration 3 weeks   PT Treatment/Interventions ADLs/Self Care Home Management;Electrical Stimulation;Cryotherapy;Therapeutic exercise;Manual techniques;Vasopneumatic Device;Gait training;Patient/family education;Passive range of motion;Scar mobilization;Neuromuscular re-education   PT Next Visit Plan Continue progressing ROM and strength for the next 3 treatments.   Consulted and Agree with Plan of Care Patient        Problem List Patient Active Problem List   Diagnosis Date Noted  . History of total knee arthroplasty 07/25/2015    Dray Dente,CHRIS, PTA 10/03/2015, 10:12 AM  [facility] [place], [place], [address] Phone: [phone]   Fax:  [phone]  Name: Frances Morrison MRN: [id number] Date of Birth: 1941/08/29

## 2015-10-09 ENCOUNTER — Encounter: Payer: Self-pay | Admitting: *Deleted

## 2015-10-09 ENCOUNTER — Ambulatory Visit: Payer: Medicare HMO | Attending: Orthopedic Surgery | Admitting: *Deleted

## 2015-10-09 DIAGNOSIS — M25661 Stiffness of right knee, not elsewhere classified: Secondary | ICD-10-CM | POA: Insufficient documentation

## 2015-10-09 DIAGNOSIS — M25461 Effusion, right knee: Secondary | ICD-10-CM | POA: Insufficient documentation

## 2015-10-09 DIAGNOSIS — M25561 Pain in right knee: Secondary | ICD-10-CM | POA: Diagnosis present

## 2015-10-09 DIAGNOSIS — R269 Unspecified abnormalities of gait and mobility: Secondary | ICD-10-CM | POA: Diagnosis present

## 2015-10-09 NOTE — Therapy (Signed)
[facility] [place], [place], [address] Phone: [phone]   Fax:  [phone]  Physical Therapy Treatment  Patient Details  Name: JANELE LAGUE MRN: [id number] Date of Birth: Sep 07, 1941 Referring Provider: Latanya Maudlin, MD  Encounter Date: 10/09/2015      PT End of Session - 10/09/15 1325    Visit Number 15   Number of Visits 16   Date for PT Re-Evaluation 10/15/15   PT Start Time 1300   PT Stop Time 6962   PT Time Calculation (min) 59 min      Past Medical History  Diagnosis Date  . Cervical cancer ([place])   . Hypertension   . PONV (postoperative nausea and vomiting)   . GERD (gastroesophageal reflux disease)   . Arthritis     arthritis -feet, hands knee    Past Surgical History  Procedure Laterality Date  . Abdominal hysterectomy    . Cholecystectomy    . Appendectomy    . Cataract extraction, bilateral Bilateral   . Tonsillectomy    . Total knee arthroplasty Right 07/25/2015    Procedure: TOTAL RIGHT KNEE ARTHROPLASTY;  Surgeon: Latanya Maudlin, MD;  Location: [facility];  Service: Orthopedics;  Laterality: Right;    There were no vitals filed for this visit.  Visit Diagnosis:  Stiffness of right knee  Swelling of right knee joint  Right knee pain  Abnormality of gait      Subjective Assessment - 10/09/15 1258    Subjective Patient reports that her knee is sore, but is doing better   Pertinent History HTN   Patient Stated Goals I'd like it to be 100%   Currently in Pain? Yes   Pain Location Knee   Pain Orientation Right   Pain Descriptors / Indicators Sore   Pain Type Surgical pain   Pain Onset More than a month ago   Pain Frequency Constant   Aggravating Factors  too long in the same position   Pain Relieving Factors rest and ice                         [facility] Adult PT Treatment/Exercise - 10/09/15 0001    Exercises   Exercises Knee/Hip   Knee/Hip Exercises: Aerobic   Nustep L5x  58mn seat 6,5 for ROM progression   Knee/Hip Exercises: Machines for Strengthening   Cybex Knee Extension 2 plates 2x 10   Cybex Knee Flexion 3 pl x5 reps; 2x10 reps 4 pl   Cybex Leg Press 1.5 plates 3x10   Knee/Hip Exercises: Standing   Rocker Board 3 minutes   Modalities   Modalities Electrical Stimulation;Vasopneumatic   Electrical Stimulation   Electrical Stimulation Location R knee  IFC x 15 mins 1-'10hz'    Electrical Stimulation Goals Edema   Vasopneumatic   Number Minutes Vasopneumatic  15 minutes   Vasopnuematic Location  Knee   Vasopneumatic Pressure Medium   Vasopneumatic Temperature  34   Manual Therapy   Manual Therapy Soft tissue mobilization   Passive ROM PROM of R knee into flex/ext with  holds at end range with focus on ext                  PT Short Term Goals - 08/29/15 0941    PT SHORT TERM GOAL #1   Title I with initial HEP 08/29/15   Period Weeks   Status Achieved   PT SHORT TERM GOAL #2   Title able  to ambulate safely without AD in home 08/29/15   Time 2   Period Weeks   Status Achieved           PT Long Term Goals - 09/26/15 0910    PT LONG TERM GOAL #1   Title I with advanced HEP   Time 6   Period Weeks   Status Achieved   PT LONG TERM GOAL #2   Title improved R knee ROM 0-120 degrees to improve function and gait   Time 6   Period Weeks   Status Partially Met  AROM R knee 0-115 deg 09/26/2015   PT LONG TERM GOAL #3   Title amb with a normal gait pattern without AD   Time 6   Period Weeks   Status Achieved   PT LONG TERM GOAL #4   Title decreased edema in R knee to within 0.5 cm of L knee   Baseline 45.5  cm   Time 6   Period Weeks   Status Achieved   PT LONG TERM GOAL #5   Title able to perform ADLS with 5/85 pain or less   Time 6   Period Weeks   Status Achieved               Plan - 10/09/15 1329    Clinical Impression Statement Pt did great today and is very pleased with her current status. She  continues to have soreness in her RT knee,but has increased ROM and strength and is progressing toward goals.   Pt will benefit from skilled therapeutic intervention in order to improve on the following deficits Abnormal gait;Decreased range of motion;Pain;Decreased scar mobility;Decreased strength;Increased edema   Rehab Potential Excellent   PT Frequency 2x / week   PT Duration 3 weeks   PT Treatment/Interventions ADLs/Self Care Home Management;Electrical Stimulation;Cryotherapy;Therapeutic exercise;Manual techniques;Vasopneumatic Device;Gait training;Patient/family education;Passive range of motion;Scar mobilization;Neuromuscular re-education   PT Next Visit Plan DC after next RX   Consulted and Agree with Plan of Care Patient        Problem List Patient Active Problem List   Diagnosis Date Noted  . History of total knee arthroplasty 07/25/2015    RAMSEUR,CHRIS, PTA 10/09/2015, 2:29 PM  [facility] [address] [place], [place], [address] Phone: [phone]   Fax:  [phone]  Name: MARGAN ELIAS MRN: [id number] Date of Birth: 03/21/1941

## 2015-10-11 ENCOUNTER — Ambulatory Visit: Payer: Medicare HMO | Admitting: Physical Therapy

## 2015-10-11 DIAGNOSIS — M25661 Stiffness of right knee, not elsewhere classified: Secondary | ICD-10-CM

## 2015-10-11 DIAGNOSIS — M25561 Pain in right knee: Secondary | ICD-10-CM

## 2015-10-11 DIAGNOSIS — M25461 Effusion, right knee: Secondary | ICD-10-CM

## 2015-10-11 NOTE — Therapy (Addendum)
[facility] [place], [place], [address] Phone: [phone]   Fax:  [phone]  Physical Therapy Treatment  Patient Details  Name: Frances Morrison MRN: [id number] Date of Birth: 1941-04-12 Referring Provider: Latanya Maudlin, MD  Encounter Date: 10/11/2015      PT End of Session - 10/11/15 1300    Visit Number 16   Number of Visits 16   Date for PT Re-Evaluation 10/15/15   PT Start Time 1304   PT Stop Time 1359   PT Time Calculation (min) 55 min   Activity Tolerance Patient tolerated treatment well   Behavior During Therapy [facility] for tasks assessed/performed      Past Medical History  Diagnosis Date  . Cervical cancer ([place])   . Hypertension   . PONV (postoperative nausea and vomiting)   . GERD (gastroesophageal reflux disease)   . Arthritis     arthritis -feet, hands knee    Past Surgical History  Procedure Laterality Date  . Abdominal hysterectomy    . Cholecystectomy    . Appendectomy    . Cataract extraction, bilateral Bilateral   . Tonsillectomy    . Total knee arthroplasty Right 07/25/2015    Procedure: TOTAL RIGHT KNEE ARTHROPLASTY;  Surgeon: Latanya Maudlin, MD;  Location: [facility];  Service: Orthopedics;  Laterality: Right;    There were no vitals filed for this visit.  Visit Diagnosis:  Stiffness of right knee  Swelling of right knee joint  Right knee pain      Subjective Assessment - 10/11/15 1304    Subjective My knee is just sore.   Patient Stated Goals I'd like it to be 100%   Currently in Pain? Yes   Pain Score 3    Pain Location Knee   Pain Orientation Right   Pain Descriptors / Indicators Sore   Pain Type Surgical pain   Pain Onset More than a month ago   Pain Frequency Constant            [facility] PT Assessment - 10/11/15 0001    AROM   Right Knee Flexion 112   PROM   Right Knee Flexion 118                     [facility] Adult PT Treatment/Exercise - 10/11/15 0001    Knee/Hip Exercises: Stretches   Other Knee/Hip Stretches Reviewed HS and knee flexion stretches for HEP   Knee/Hip Exercises: Aerobic   Stationary Bike Seat 3 to 1 for ROM x 5 min   Nustep L5x  84mn seat 6,5 for ROM progression   Knee/Hip Exercises: Machines for Strengthening   Cybex Knee Extension 3 plates x 10; 2 plates x 10   Cybex Knee Flexion 4 plates x    Cybex Leg Press 1.5 plates 3x10   Modalities   Modalities Electrical Stimulation;Vasopneumatic   Electrical Stimulation   Electrical Stimulation Location R knee  IFC x 15 mins 1-_0    Electrical Stimulation Goals Edema   Vasopneumatic   Number Minutes Vasopneumatic  15 minutes   Vasopnuematic Location  Knee   Vasopneumatic Pressure Medium   Vasopneumatic Temperature  39                  PT Short Term Goals - 08/29/15 0941    PT SHORT TERM GOAL #1   Title I with initial HEP 08/29/15   Period Weeks   Status Achieved   PT SHORT TERM GOAL #2  Title able to ambulate safely without AD in home 08/29/15   Time 2   Period Weeks   Status Achieved           PT Long Term Goals - October 24, 2015 1347    PT LONG TERM GOAL #1   Title I with advanced HEP   Time 6   Status Achieved   PT LONG TERM GOAL #2   Title improved R knee ROM 0-120 degrees to improve function and gait   Time 6   Period Weeks   Status Partially Met   PT LONG TERM GOAL #3   Title amb with a normal gait pattern without AD   Time 6   Period Weeks   Status Achieved   PT LONG TERM GOAL #4   Title decreased edema in R knee to within 0.5 cm of L knee   Time 6   Period Weeks   Status Achieved   PT LONG TERM GOAL #5   Title able to perform ADLS with 1/32 pain or less   Time 6   Period Weeks   Status Achieved               Plan - 2015-10-24 1348    Clinical Impression Statement Patient has met or partially met all of her LTGs and is pleased with her current functional level. She still lacks full knee flexion. She is comfortable with HEP  and plans to join the gym program to maintain strength.   Pt will benefit from skilled therapeutic intervention in order to improve on the following deficits Abnormal gait;Decreased range of motion;Pain;Decreased scar mobility;Decreased strength;Increased edema   Rehab Potential Excellent   PT Frequency 2x / week   PT Duration 3 weeks   PT Treatment/Interventions ADLs/Self Care Home Management;Electrical Stimulation;Cryotherapy;Therapeutic exercise;Manual techniques;Vasopneumatic Device;Gait training;Patient/family education;Passive range of motion;Scar mobilization;Neuromuscular re-education   PT Next Visit Plan DC to HEP   Consulted and Agree with Plan of Care Patient          G-Codes - October 24, 2015 1312    Functional Assessment Tool Used FOTO D/C  visit 41% limitation   Functional Limitation Mobility: Walking and moving around   Mobility: Walking and Moving Around Current Status [id number]) At least 40 percent but less than 60 percent impaired, limited or restricted   Mobility: Walking and Moving Around Goal Status [phone]) At least 40 percent but less than 60 percent impaired, limited or restricted   Mobility: Walking and Moving Around Discharge Status [phone]) At least 40 percent but less than 60 percent impaired, limited or restricted      Problem List Patient Active Problem List   Diagnosis Date Noted  . History of total knee arthroplasty 07/25/2015    [name] PT  2015-10-24, 2:47 PM  [facility] [address] [place], [place], [address] Phone: [phone]   Fax:  [phone]  Name: Frances Morrison MRN: [id number] Date of Birth: 1941/06/14   PHYSICAL THERAPY DISCHARGE SUMMARY  Visits from Start of Care: 16  Current functional level related to goals / functional outcomes: See above   Remaining deficits: See above   Education / Equipment: HEP  Plan: Patient agrees to discharge.  Patient goals were partially met. Patient is  being discharged due to being pleased with the current functional level.  ?????        [name], PT 24-Oct-2015 2:49 PM [facility] [place], [place], [address] Phone: [phone]  Fax:  [phone]

## 2017-04-27 DIAGNOSIS — E784 Other hyperlipidemia: Secondary | ICD-10-CM | POA: Diagnosis not present

## 2017-04-27 DIAGNOSIS — R799 Abnormal finding of blood chemistry, unspecified: Secondary | ICD-10-CM | POA: Diagnosis not present

## 2017-04-28 DIAGNOSIS — I10 Essential (primary) hypertension: Secondary | ICD-10-CM | POA: Diagnosis not present

## 2017-04-28 DIAGNOSIS — M1990 Unspecified osteoarthritis, unspecified site: Secondary | ICD-10-CM | POA: Diagnosis not present

## 2017-04-28 DIAGNOSIS — E785 Hyperlipidemia, unspecified: Secondary | ICD-10-CM | POA: Diagnosis not present

## 2017-04-28 DIAGNOSIS — K219 Gastro-esophageal reflux disease without esophagitis: Secondary | ICD-10-CM | POA: Diagnosis not present

## 2017-04-30 DIAGNOSIS — Z780 Asymptomatic menopausal state: Secondary | ICD-10-CM | POA: Diagnosis not present

## 2017-04-30 DIAGNOSIS — M810 Age-related osteoporosis without current pathological fracture: Secondary | ICD-10-CM | POA: Diagnosis not present

## 2017-05-08 DIAGNOSIS — M810 Age-related osteoporosis without current pathological fracture: Secondary | ICD-10-CM | POA: Diagnosis not present

## 2017-05-08 DIAGNOSIS — I10 Essential (primary) hypertension: Secondary | ICD-10-CM | POA: Diagnosis not present

## 2017-10-23 DIAGNOSIS — E785 Hyperlipidemia, unspecified: Secondary | ICD-10-CM | POA: Diagnosis not present

## 2017-10-23 DIAGNOSIS — R799 Abnormal finding of blood chemistry, unspecified: Secondary | ICD-10-CM | POA: Diagnosis not present

## 2017-10-26 DIAGNOSIS — M810 Age-related osteoporosis without current pathological fracture: Secondary | ICD-10-CM | POA: Diagnosis not present

## 2017-10-26 DIAGNOSIS — K219 Gastro-esophageal reflux disease without esophagitis: Secondary | ICD-10-CM | POA: Diagnosis not present

## 2017-10-26 DIAGNOSIS — Z23 Encounter for immunization: Secondary | ICD-10-CM | POA: Diagnosis not present

## 2017-10-26 DIAGNOSIS — E559 Vitamin D deficiency, unspecified: Secondary | ICD-10-CM | POA: Diagnosis not present

## 2017-10-26 DIAGNOSIS — I10 Essential (primary) hypertension: Secondary | ICD-10-CM | POA: Diagnosis not present

## 2017-11-19 DIAGNOSIS — I10 Essential (primary) hypertension: Secondary | ICD-10-CM | POA: Diagnosis not present

## 2017-11-19 DIAGNOSIS — R21 Rash and other nonspecific skin eruption: Secondary | ICD-10-CM | POA: Diagnosis not present

## 2017-11-19 DIAGNOSIS — K115 Sialolithiasis: Secondary | ICD-10-CM | POA: Diagnosis not present

## 2018-01-25 DIAGNOSIS — I10 Essential (primary) hypertension: Secondary | ICD-10-CM | POA: Diagnosis not present

## 2018-01-25 DIAGNOSIS — Z0000 Encounter for general adult medical examination without abnormal findings: Secondary | ICD-10-CM | POA: Diagnosis not present

## 2018-01-28 DIAGNOSIS — I10 Essential (primary) hypertension: Secondary | ICD-10-CM | POA: Diagnosis not present

## 2018-01-28 DIAGNOSIS — K219 Gastro-esophageal reflux disease without esophagitis: Secondary | ICD-10-CM | POA: Diagnosis not present

## 2018-01-28 DIAGNOSIS — G8929 Other chronic pain: Secondary | ICD-10-CM | POA: Diagnosis not present

## 2018-01-28 DIAGNOSIS — M810 Age-related osteoporosis without current pathological fracture: Secondary | ICD-10-CM | POA: Diagnosis not present

## 2018-01-28 DIAGNOSIS — Z6832 Body mass index (BMI) 32.0-32.9, adult: Secondary | ICD-10-CM | POA: Diagnosis not present

## 2018-01-28 DIAGNOSIS — E669 Obesity, unspecified: Secondary | ICD-10-CM | POA: Diagnosis not present

## 2018-01-28 DIAGNOSIS — Z791 Long term (current) use of non-steroidal anti-inflammatories (NSAID): Secondary | ICD-10-CM | POA: Diagnosis not present

## 2018-01-28 DIAGNOSIS — J309 Allergic rhinitis, unspecified: Secondary | ICD-10-CM | POA: Diagnosis not present

## 2018-01-28 DIAGNOSIS — G629 Polyneuropathy, unspecified: Secondary | ICD-10-CM | POA: Diagnosis not present

## 2018-01-28 DIAGNOSIS — K08409 Partial loss of teeth, unspecified cause, unspecified class: Secondary | ICD-10-CM | POA: Diagnosis not present

## 2018-04-23 DIAGNOSIS — E785 Hyperlipidemia, unspecified: Secondary | ICD-10-CM | POA: Diagnosis not present

## 2018-04-23 DIAGNOSIS — R799 Abnormal finding of blood chemistry, unspecified: Secondary | ICD-10-CM | POA: Diagnosis not present

## 2018-04-26 DIAGNOSIS — K219 Gastro-esophageal reflux disease without esophagitis: Secondary | ICD-10-CM | POA: Diagnosis not present

## 2018-04-26 DIAGNOSIS — D649 Anemia, unspecified: Secondary | ICD-10-CM | POA: Diagnosis not present

## 2018-04-26 DIAGNOSIS — R7309 Other abnormal glucose: Secondary | ICD-10-CM | POA: Diagnosis not present

## 2018-04-26 DIAGNOSIS — E559 Vitamin D deficiency, unspecified: Secondary | ICD-10-CM | POA: Diagnosis not present

## 2018-04-26 DIAGNOSIS — M1990 Unspecified osteoarthritis, unspecified site: Secondary | ICD-10-CM | POA: Diagnosis not present

## 2018-04-26 DIAGNOSIS — I10 Essential (primary) hypertension: Secondary | ICD-10-CM | POA: Diagnosis not present

## 2018-04-26 DIAGNOSIS — M810 Age-related osteoporosis without current pathological fracture: Secondary | ICD-10-CM | POA: Diagnosis not present

## 2018-04-29 DIAGNOSIS — Z1211 Encounter for screening for malignant neoplasm of colon: Secondary | ICD-10-CM | POA: Diagnosis not present

## 2018-05-11 DIAGNOSIS — Z1231 Encounter for screening mammogram for malignant neoplasm of breast: Secondary | ICD-10-CM | POA: Diagnosis not present

## 2018-07-14 DIAGNOSIS — L309 Dermatitis, unspecified: Secondary | ICD-10-CM | POA: Diagnosis not present

## 2018-07-14 DIAGNOSIS — Z23 Encounter for immunization: Secondary | ICD-10-CM | POA: Diagnosis not present

## 2018-07-14 DIAGNOSIS — I10 Essential (primary) hypertension: Secondary | ICD-10-CM | POA: Diagnosis not present

## 2018-08-24 DIAGNOSIS — K219 Gastro-esophageal reflux disease without esophagitis: Secondary | ICD-10-CM | POA: Diagnosis not present

## 2018-08-24 DIAGNOSIS — I10 Essential (primary) hypertension: Secondary | ICD-10-CM | POA: Diagnosis not present

## 2018-08-27 DIAGNOSIS — M810 Age-related osteoporosis without current pathological fracture: Secondary | ICD-10-CM | POA: Diagnosis not present

## 2018-08-27 DIAGNOSIS — E559 Vitamin D deficiency, unspecified: Secondary | ICD-10-CM | POA: Diagnosis not present

## 2018-08-27 DIAGNOSIS — D649 Anemia, unspecified: Secondary | ICD-10-CM | POA: Diagnosis not present

## 2018-08-27 DIAGNOSIS — M1990 Unspecified osteoarthritis, unspecified site: Secondary | ICD-10-CM | POA: Diagnosis not present

## 2018-08-27 DIAGNOSIS — I10 Essential (primary) hypertension: Secondary | ICD-10-CM | POA: Diagnosis not present

## 2018-08-27 DIAGNOSIS — K219 Gastro-esophageal reflux disease without esophagitis: Secondary | ICD-10-CM | POA: Diagnosis not present

## 2018-10-25 DIAGNOSIS — R21 Rash and other nonspecific skin eruption: Secondary | ICD-10-CM | POA: Diagnosis not present

## 2018-11-08 DIAGNOSIS — L301 Dyshidrosis [pompholyx]: Secondary | ICD-10-CM | POA: Insufficient documentation

## 2018-12-07 DIAGNOSIS — H5203 Hypermetropia, bilateral: Secondary | ICD-10-CM | POA: Diagnosis not present

## 2018-12-07 DIAGNOSIS — H52229 Regular astigmatism, unspecified eye: Secondary | ICD-10-CM | POA: Diagnosis not present

## 2018-12-07 DIAGNOSIS — Z0100 Encounter for examination of eyes and vision without abnormal findings: Secondary | ICD-10-CM | POA: Diagnosis not present

## 2018-12-07 DIAGNOSIS — H524 Presbyopia: Secondary | ICD-10-CM | POA: Diagnosis not present

## 2018-12-24 DIAGNOSIS — R69 Illness, unspecified: Secondary | ICD-10-CM | POA: Diagnosis not present

## 2019-01-28 DIAGNOSIS — M25512 Pain in left shoulder: Secondary | ICD-10-CM | POA: Diagnosis not present

## 2019-01-30 DIAGNOSIS — M25512 Pain in left shoulder: Secondary | ICD-10-CM | POA: Diagnosis not present

## 2019-02-10 DIAGNOSIS — M7552 Bursitis of left shoulder: Secondary | ICD-10-CM | POA: Diagnosis not present

## 2019-02-10 DIAGNOSIS — M25512 Pain in left shoulder: Secondary | ICD-10-CM | POA: Diagnosis not present

## 2019-03-25 ENCOUNTER — Encounter (INDEPENDENT_AMBULATORY_CARE_PROVIDER_SITE_OTHER): Payer: Self-pay

## 2019-03-28 ENCOUNTER — Other Ambulatory Visit: Payer: Self-pay

## 2019-03-29 ENCOUNTER — Encounter: Payer: Self-pay | Admitting: Family

## 2019-03-29 ENCOUNTER — Ambulatory Visit (INDEPENDENT_AMBULATORY_CARE_PROVIDER_SITE_OTHER): Payer: Medicare HMO | Admitting: Family

## 2019-03-29 ENCOUNTER — Other Ambulatory Visit: Payer: Self-pay

## 2019-03-29 VITALS — BP 145/85 | HR 67 | Temp 97.5°F | Ht 63.0 in | Wt 170.8 lb

## 2019-03-29 DIAGNOSIS — E782 Mixed hyperlipidemia: Secondary | ICD-10-CM | POA: Diagnosis not present

## 2019-03-29 DIAGNOSIS — E669 Obesity, unspecified: Secondary | ICD-10-CM

## 2019-03-29 DIAGNOSIS — K219 Gastro-esophageal reflux disease without esophagitis: Secondary | ICD-10-CM | POA: Diagnosis not present

## 2019-03-29 DIAGNOSIS — E559 Vitamin D deficiency, unspecified: Secondary | ICD-10-CM

## 2019-03-29 DIAGNOSIS — M1990 Unspecified osteoarthritis, unspecified site: Secondary | ICD-10-CM | POA: Diagnosis not present

## 2019-03-29 DIAGNOSIS — M810 Age-related osteoporosis without current pathological fracture: Secondary | ICD-10-CM | POA: Diagnosis not present

## 2019-03-29 DIAGNOSIS — I10 Essential (primary) hypertension: Secondary | ICD-10-CM

## 2019-03-29 LAB — LIPID PANEL

## 2019-03-29 MED ORDER — AMLODIPINE BESYLATE 5 MG PO TABS: 5.0000 mg | ORAL_TABLET | Freq: Every day | ORAL | 2 refills | Status: DC

## 2019-03-29 MED ORDER — ESOMEPRAZOLE MAGNESIUM 40 MG PO CPDR: 40.0000 mg | DELAYED_RELEASE_CAPSULE | Freq: Every day | ORAL | 2 refills | Status: DC

## 2019-03-29 MED ORDER — OLOPATADINE HCL 0.2 % OP SOLN: 1.0000 [drp] | Freq: Every day | OPHTHALMIC | 6 refills | Status: DC

## 2019-03-29 MED ORDER — ALENDRONATE SODIUM 70 MG PO TABS: 70.0000 mg | ORAL_TABLET | ORAL | 2 refills | Status: DC

## 2019-03-29 MED ORDER — MELOXICAM 7.5 MG PO TABS: 7.5000 mg | ORAL_TABLET | Freq: Every day | ORAL | 2 refills | Status: DC

## 2019-03-29 MED ORDER — CETIRIZINE HCL 5 MG PO TABS: 5.0000 mg | ORAL_TABLET | Freq: Every day | ORAL | 1 refills | Status: DC

## 2019-03-29 NOTE — Patient Instructions (Signed)
Allergic Conjunctivitis  A clear membrane (conjunctiva) covers the white part of your eye and the inner surface of your eyelid. Allergic conjunctivitis happens when this membrane has inflammation. This is caused by allergies. Common causes of allergic reactions (allergens)include:  · Outdoor allergens, such as:  ? Pollen.  ? Grass and weeds.  ? Mold spores.  · Indoor allergens, such as:  ? Dust.  ? Smoke.  ? Mold.  ? Pet dander.  ? Animal hair.  This condition can make your eye red or pink. It can also make your eye feel itchy. This condition cannot be spread from one person to another person (is not contagious).  Follow these instructions at home:  · Try not to be around things that you are allergic to.  · Take or apply over-the-counter and prescription medicines only as told by your doctor. These include any eye drops.  · Place a cool, clean washcloth on your eye for 10-20 minutes. Do this 3-4 times a day.  · Do not touch or rub your eyes.  · Do not wear contact lenses until the inflammation is gone. Wear glasses instead.  · Do not wear eye makeup until the inflammation is gone.  · Keep all follow-up visits as told by your doctor. This is important.  Contact a doctor if:  · Your symptoms get worse.  · Your symptoms do not get better with treatment.  · You have mild eye pain.  · You are sensitive to light,  · You have spots or blisters on your eyes.  · You have pus coming from your eye.  · You have a fever.  Get help right away if:  · You have redness, swelling, or other symptoms in only one eye.  · Your vision is blurry.  · You have vision changes.  · You have very bad eye pain.  Summary  · Allergic conjunctivitis is caused by allergies. It can make your eye red or pink, and it can make your eye feel itchy.  · This condition cannot be spread from one person to another person (is not contagious).  · Try not to be around things that you are allergic to.  · Take or apply over-the-counter and prescription medicines  only as told by your doctor. These include any eye drops.  · Contact your doctor if your symptoms get worse or they do not get better with treatment.  This information is not intended to replace advice given to you by your health care provider. Make sure you discuss any questions you have with your health care provider.  Document Released: 03/12/2010 Document Revised: 05/16/2016 Document Reviewed: 05/16/2016  [employer]vier Interactive Patient Education © 2019 [employer]

## 2019-03-29 NOTE — Progress Notes (Addendum)
Subjective:    Patient ID: Frances Morrison, female    DOB: Mar 31, 1941, 78 y.o.   MRN: [id number]  Chief Complaint  Patient presents with  . New Patient (Initial Visit)   PT presents to the office today to establish care since her provider is retiring.  Hypertension This is a chronic problem. The current episode started more than 1 year ago. The problem has been resolved since onset. The problem is controlled. Pertinent negatives include no malaise/fatigue, peripheral edema or shortness of breath. Risk factors for coronary artery disease include dyslipidemia, obesity and sedentary lifestyle. The current treatment provides moderate improvement. There is no history of kidney disease, CVA or heart failure.  Gastroesophageal Reflux She reports no belching or no heartburn. This is a chronic problem. The current episode started more than 1 year ago. The problem occurs occasionally. The problem has been resolved. Risk factors include obesity. She has tried a PPI for the symptoms. The treatment provided moderate relief.  Arthritis Presents for follow-up visit. She complains of pain and stiffness. The symptoms have been stable. Affected locations include the right MCP, left MCP, right foot and left foot. Her pain is at a severity of 5/10.  Hyperlipidemia This is a chronic problem. The current episode started more than 1 year ago. The problem is uncontrolled. Exacerbating diseases include obesity. Pertinent negatives include no shortness of breath. Current antihyperlipidemic treatment includes diet change. The current treatment provides mild improvement of lipids. Risk factors for coronary artery disease include dyslipidemia.   Osteoporosis  PT currently taking Fosamax. Last Dexascan 05/06/17.   Review of Systems  Constitutional: Negative for malaise/fatigue.  Respiratory: Negative for shortness of breath.   Gastrointestinal: Negative for heartburn.  Musculoskeletal: Positive for arthritis and  stiffness.  All other systems reviewed and are negative.  Family History  Problem Relation Age of Onset  . Cancer Mother   . Cancer Father    Social History   Socioeconomic History  . Marital status: Married    Spouse name: Not on file  . Number of children: Not on file  . Years of education: Not on file  . Highest education level: Not on file  Occupational History  . Not on file  Social Needs  . Financial resource strain: Not on file  . Food insecurity    Worry: Not on file    Inability: Not on file  . Transportation needs    Medical: Not on file    Non-medical: Not on file  Tobacco Use  . Smoking status: Former Smoker    Packs/day: 1.00    Years: 12.00    Pack years: 12.00    Types: Cigarettes    Quit date: 07/10/2010    Years since quitting: 8.7  . Smokeless tobacco: Never Used  Substance and Sexual Activity  . Alcohol use: No  . Drug use: No  . Sexual activity: Not Currently  Lifestyle  . Physical activity    Days per week: Not on file    Minutes per session: Not on file  . Stress: Not on file  Relationships  . Social Herbalist on phone: Not on file    Gets together: Not on file    Attends religious service: Not on file    Active member of club or organization: Not on file    Attends meetings of clubs or organizations: Not on file    Relationship status: Not on file  Other Topics Concern  . Not on  file  Social History Narrative  . Not on file       Objective:   Physical Exam Vitals signs reviewed.  Constitutional:      General: She is not in acute distress.    Appearance: She is well-developed.  HENT:     Head: Normocephalic and atraumatic.     Right Ear: Tympanic membrane and external ear normal.     Left Ear: Tympanic membrane normal.  Eyes:     Pupils: Pupils are equal, round, and reactive to light.     Comments: Bilateral lids erythemas and mild swelling and tearing present  Neck:     Musculoskeletal: Normal range of motion  and neck supple.     Thyroid: No thyromegaly.  Cardiovascular:     Rate and Rhythm: Normal rate and regular rhythm.     Heart sounds: Normal heart sounds. No murmur.  Pulmonary:     Effort: Pulmonary effort is normal. No respiratory distress.     Breath sounds: Normal breath sounds. No wheezing.  Abdominal:     General: Bowel sounds are normal. There is no distension.     Palpations: Abdomen is soft.     Tenderness: There is no abdominal tenderness.  Musculoskeletal: Normal range of motion.        General: No tenderness.  Skin:    General: Skin is warm and dry.  Neurological:     Mental Status: She is alert and oriented to person, place, and time.     Cranial Nerves: No cranial nerve deficit.     Deep Tendon Reflexes: Reflexes are normal and symmetric.  Psychiatric:        Behavior: Behavior normal.        Thought Content: Thought content normal.        Judgment: Judgment normal.       BP (!) 145/85   Pulse 67   Temp (!) 97.5 F (36.4 C) (Oral)   Ht 5' 3" (1.6 m)   Wt 170 lb 12.8 oz (77.5 kg)   BMI 30.26 kg/m      Assessment & Plan:  Frances Morrison comes in today with chief complaint of New Patient (Initial Visit)   Diagnosis and orders addressed:  1. Essential hypertension - CMP14+EGFR - CBC with Differential/Platelet  2. Arthritis - CMP14+EGFR - CBC with Differential/Platelet  3. Gastroesophageal reflux disease, esophagitis presence not specified - CMP14+EGFR - CBC with Differential/Platelet  4. Osteoporosis, unspecified osteoporosis type, unspecified pathological fracture presence - CMP14+EGFR - CBC with Differential/Platelet  5. Mixed hyperlipidemia - CMP14+EGFR - CBC with Differential/Platelet - Lipid panel  6. Vitamin D deficiency - CMP14+EGFR - CBC with Differential/Platelet - VITAMIN D 25 Hydroxy (Vit-D Deficiency, Fractures)  7. Obesity (BMI 30-39.9)   Labs pending Health Maintenance reviewed Diet and exercise encouraged  Follow up  plan: 6 months    [name], FNP

## 2019-03-29 NOTE — Addendum Note (Signed)
Addended by: [name] A on: 03/29/2019 10:14 AM   Modules accepted: Level of Service

## 2019-03-30 LAB — LIPID PANEL
Chol/HDL Ratio: 2.5 ratio (ref 0.0–4.4)
Cholesterol, Total: 183 mg/dL (ref 100–199)
HDL: 72 mg/dL (ref >39)
LDL Calculated: 94 mg/dL (ref 0–99)
Triglycerides: 85 mg/dL (ref 0–149)
VLDL Cholesterol Cal: 17 mg/dL (ref 5–40)

## 2019-03-30 LAB — CMP14+EGFR
ALT: 16 IU/L (ref 0–32)
AST: 17 IU/L (ref 0–40)
Albumin/Globulin Ratio: 1.8 (ref 1.2–2.2)
Albumin: 4.2 g/dL (ref 3.7–4.7)
Alkaline Phosphatase: 84 IU/L (ref 39–117)
BUN/Creatinine Ratio: 21 (ref 12–28)
BUN: 21 mg/dL (ref 8–27)
Bilirubin Total: 0.3 mg/dL (ref 0.0–1.2)
CO2: 25 mmol/L (ref 20–29)
Calcium: 9.4 mg/dL (ref 8.7–10.3)
Chloride: 102 mmol/L (ref 96–106)
Creatinine, Ser: 1 mg/dL (ref 0.57–1.00)
GFR calc Af Amer: 62 mL/min/{1.73_m2} (ref >59)
GFR calc non Af Amer: 54 mL/min/{1.73_m2} — ABNORMAL LOW (ref >59)
Globulin, Total: 2.4 g/dL (ref 1.5–4.5)
Glucose: 97 mg/dL (ref 65–99)
Potassium: 4.1 mmol/L (ref 3.5–5.2)
Sodium: 142 mmol/L (ref 134–144)
Total Protein: 6.6 g/dL (ref 6.0–8.5)

## 2019-03-30 LAB — CBC WITH DIFFERENTIAL/PLATELET
Basophils Absolute: 0.1 10*3/uL (ref 0.0–0.2)
Basos: 1 %
EOS (ABSOLUTE): 0.4 10*3/uL (ref 0.0–0.4)
Eos: 6 %
Hematocrit: 36.7 % (ref 34.0–46.6)
Hemoglobin: 11.6 g/dL (ref 11.1–15.9)
Immature Grans (Abs): 0.1 10*3/uL (ref 0.0–0.1)
Immature Granulocytes: 1 %
Lymphocytes Absolute: 1.9 10*3/uL (ref 0.7–3.1)
Lymphs: 23 %
MCH: 25.8 pg — ABNORMAL LOW (ref 26.6–33.0)
MCHC: 31.6 g/dL (ref 31.5–35.7)
MCV: 82 fL (ref 79–97)
Monocytes Absolute: 0.5 10*3/uL (ref 0.1–0.9)
Monocytes: 6 %
Neutrophils Absolute: 5.1 10*3/uL (ref 1.4–7.0)
Neutrophils: 63 %
Platelets: 196 10*3/uL (ref 150–450)
RBC: 4.49 x10E6/uL (ref 3.77–5.28)
RDW: 15.4 % (ref 11.7–15.4)
WBC: 7.9 10*3/uL (ref 3.4–10.8)

## 2019-03-30 LAB — VITAMIN D 25 HYDROXY (VIT D DEFICIENCY, FRACTURES): Vit D, 25-Hydroxy: 43.3 ng/mL (ref 30.0–100.0)

## 2019-03-31 ENCOUNTER — Other Ambulatory Visit: Payer: Self-pay | Admitting: Family

## 2019-03-31 MED ORDER — ATORVASTATIN CALCIUM 20 MG PO TABS: 20.0000 mg | ORAL_TABLET | Freq: Every day | ORAL | 3 refills | Status: DC

## 2019-06-23 LAB — HM MAMMOGRAPHY

## 2019-06-29 DIAGNOSIS — Z1231 Encounter for screening mammogram for malignant neoplasm of breast: Secondary | ICD-10-CM | POA: Diagnosis not present

## 2019-09-27 ENCOUNTER — Ambulatory Visit (INDEPENDENT_AMBULATORY_CARE_PROVIDER_SITE_OTHER): Payer: Medicare HMO

## 2019-09-27 ENCOUNTER — Other Ambulatory Visit: Payer: Self-pay

## 2019-09-27 ENCOUNTER — Encounter: Payer: Self-pay | Admitting: Family

## 2019-09-27 ENCOUNTER — Ambulatory Visit (INDEPENDENT_AMBULATORY_CARE_PROVIDER_SITE_OTHER): Payer: Medicare HMO | Admitting: Family

## 2019-09-27 DIAGNOSIS — Z23 Encounter for immunization: Secondary | ICD-10-CM

## 2019-09-27 DIAGNOSIS — E669 Obesity, unspecified: Secondary | ICD-10-CM | POA: Diagnosis not present

## 2019-09-27 DIAGNOSIS — K219 Gastro-esophageal reflux disease without esophagitis: Secondary | ICD-10-CM | POA: Diagnosis not present

## 2019-09-27 DIAGNOSIS — E782 Mixed hyperlipidemia: Secondary | ICD-10-CM

## 2019-09-27 DIAGNOSIS — I10 Essential (primary) hypertension: Secondary | ICD-10-CM | POA: Diagnosis not present

## 2019-09-27 DIAGNOSIS — M1990 Unspecified osteoarthritis, unspecified site: Secondary | ICD-10-CM

## 2019-09-27 NOTE — Addendum Note (Signed)
Addended by: Michaela Corner on: 09/27/2019 11:04 AM   Modules accepted: Orders

## 2019-09-27 NOTE — Addendum Note (Signed)
Addended by: [name] A on: 09/27/2019 10:29 AM   Modules accepted: Orders

## 2019-09-27 NOTE — Progress Notes (Signed)
Virtual Visit via telephone Note Due to COVID-19 pandemic this visit was conducted virtually. This visit type was conducted due to national recommendations for restrictions regarding the COVID-19 Pandemic (e.g. social distancing, sheltering in place) in an effort to limit this patient's exposure and mitigate transmission in our community. All issues noted in this document were discussed and addressed.  A physical exam was not performed with this format.  I connected with Frances Morrison on 09/27/19 at 8:08 pm  by telephone and verified that I am speaking with the correct person using two identifiers. Frances Morrison is currently located at home and husband is currently with her during visit. The provider, [name], FNP is located in their office at time of visit.  I discussed the limitations, risks, security and privacy concerns of performing an evaluation and management service by telephone and the availability of in person appointments. I also discussed with the patient that there may be a patient responsible charge related to this service. The patient expressed understanding and agreed to proceed.   History and Present Illness:  Pt calls the office today for chronic follow up. Hypertension This is a chronic problem. The current episode started more than 1 year ago. The problem has been resolved since onset. The problem is controlled. Pertinent negatives include no chest pain, malaise/fatigue, peripheral edema or shortness of breath. Risk factors for coronary artery disease include dyslipidemia and sedentary lifestyle. The current treatment provides moderate improvement. There is no history of kidney disease, CAD/MI or heart failure.  Hyperlipidemia This is a chronic problem. The current episode started more than 1 year ago. The problem is controlled. Recent lipid tests were reviewed and are normal. Pertinent negatives include no chest pain or shortness of breath. Current antihyperlipidemic  treatment includes diet change. The current treatment provides no improvement of lipids. Risk factors for coronary artery disease include dyslipidemia, hypertension and a sedentary lifestyle.  Arthritis Presents for follow-up visit. She complains of pain and stiffness. The symptoms have been stable. Affected locations include the right knee and left knee.  Gastroesophageal Reflux She complains of belching and heartburn. She reports no chest pain. This is a chronic problem. The current episode started more than 1 year ago. The problem occurs rarely. The problem has been waxing and waning. The symptoms are aggravated by exertion. She has tried a PPI for the symptoms. The treatment provided moderate relief.    BP-130/79  Review of Systems  Constitutional: Negative for malaise/fatigue.  Respiratory: Negative for shortness of breath.   Cardiovascular: Negative for chest pain.  Gastrointestinal: Positive for heartburn.  Musculoskeletal: Positive for arthritis and stiffness.     Observations/Objective: No SOB or distress noted   Assessment and Plan: Frances Morrison comes in today with chief complaint of No chief complaint on file.   Diagnosis and orders addressed:  1. Essential hypertension  2. Gastroesophageal reflux disease, unspecified whether esophagitis present  3. Arthritis  4. Mixed hyperlipidemia  5. Obesity (BMI 30-39.9)   Labs reviewed from last visit, stable. Will hold off until next visit. Health Maintenance reviewed- Will come in and get flu and TDAP Diet and exercise encouraged  Follow up plan: 6 months       I discussed the assessment and treatment plan with the patient. The patient was provided an opportunity to ask questions and all were answered. The patient agreed with the plan and demonstrated an understanding of the instructions.   The patient was advised to call back or  seek an in-person evaluation if the symptoms worsen or if the condition fails to  improve as anticipated.  The above assessment and management plan was discussed with the patient. The patient verbalized understanding of and has agreed to the management plan. Patient is aware to call the clinic if symptoms persist or worsen. Patient is aware when to return to the clinic for a follow-up visit. Patient educated on when it is appropriate to go to the emergency department.   Time call ended:  8:30 AM  I provided 22 minutes of non-face-to-face time during this encounter.    [name], FNP

## 2019-09-28 ENCOUNTER — Other Ambulatory Visit: Payer: Self-pay | Admitting: Family

## 2019-09-28 LAB — CBC WITH DIFFERENTIAL/PLATELET
Basophils Absolute: 0 10*3/uL (ref 0.0–0.2)
Basos: 1 %
EOS (ABSOLUTE): 0.2 10*3/uL (ref 0.0–0.4)
Eos: 4 %
Hematocrit: 33.7 % — ABNORMAL LOW (ref 34.0–46.6)
Hemoglobin: 10.6 g/dL — ABNORMAL LOW (ref 11.1–15.9)
Immature Grans (Abs): 0 10*3/uL (ref 0.0–0.1)
Immature Granulocytes: 0 %
Lymphocytes Absolute: 1.3 10*3/uL (ref 0.7–3.1)
Lymphs: 26 %
MCH: 26.2 pg — ABNORMAL LOW (ref 26.6–33.0)
MCHC: 31.5 g/dL (ref 31.5–35.7)
MCV: 83 fL (ref 79–97)
Monocytes Absolute: 0.4 10*3/uL (ref 0.1–0.9)
Monocytes: 7 %
Neutrophils Absolute: 3.3 10*3/uL (ref 1.4–7.0)
Neutrophils: 62 %
Platelets: 214 10*3/uL (ref 150–450)
RBC: 4.05 x10E6/uL (ref 3.77–5.28)
RDW: 14.4 % (ref 11.7–15.4)
WBC: 5.2 10*3/uL (ref 3.4–10.8)

## 2019-09-28 LAB — CMP14+EGFR
ALT: 17 IU/L (ref 0–32)
AST: 24 IU/L (ref 0–40)
Albumin/Globulin Ratio: 1.8 (ref 1.2–2.2)
Albumin: 4.2 g/dL (ref 3.7–4.7)
Alkaline Phosphatase: 94 IU/L (ref 39–117)
BUN/Creatinine Ratio: 16 (ref 12–28)
BUN: 14 mg/dL (ref 8–27)
Bilirubin Total: 0.2 mg/dL (ref 0.0–1.2)
CO2: 24 mmol/L (ref 20–29)
Calcium: 8.8 mg/dL (ref 8.7–10.3)
Chloride: 106 mmol/L (ref 96–106)
Creatinine, Ser: 0.89 mg/dL (ref 0.57–1.00)
GFR calc Af Amer: 71 mL/min/{1.73_m2} (ref >59)
GFR calc non Af Amer: 62 mL/min/{1.73_m2} (ref >59)
Globulin, Total: 2.3 g/dL (ref 1.5–4.5)
Glucose: 149 mg/dL — ABNORMAL HIGH (ref 65–99)
Potassium: 4.4 mmol/L (ref 3.5–5.2)
Sodium: 141 mmol/L (ref 134–144)
Total Protein: 6.5 g/dL (ref 6.0–8.5)

## 2019-10-20 DIAGNOSIS — M25511 Pain in right shoulder: Secondary | ICD-10-CM | POA: Diagnosis not present

## 2019-10-20 DIAGNOSIS — G8929 Other chronic pain: Secondary | ICD-10-CM | POA: Diagnosis not present

## 2019-10-20 DIAGNOSIS — M25512 Pain in left shoulder: Secondary | ICD-10-CM | POA: Diagnosis not present

## 2019-10-20 DIAGNOSIS — M19011 Primary osteoarthritis, right shoulder: Secondary | ICD-10-CM | POA: Diagnosis not present

## 2019-10-20 DIAGNOSIS — M19012 Primary osteoarthritis, left shoulder: Secondary | ICD-10-CM | POA: Diagnosis not present

## 2019-12-05 DIAGNOSIS — M7541 Impingement syndrome of right shoulder: Secondary | ICD-10-CM | POA: Diagnosis not present

## 2019-12-05 DIAGNOSIS — M25611 Stiffness of right shoulder, not elsewhere classified: Secondary | ICD-10-CM | POA: Diagnosis not present

## 2019-12-06 ENCOUNTER — Other Ambulatory Visit: Payer: Self-pay | Admitting: Physician Assistant

## 2019-12-06 ENCOUNTER — Other Ambulatory Visit (HOSPITAL_COMMUNITY): Payer: Self-pay | Admitting: Physician Assistant

## 2019-12-06 DIAGNOSIS — G8929 Other chronic pain: Secondary | ICD-10-CM

## 2019-12-06 DIAGNOSIS — M25511 Pain in right shoulder: Secondary | ICD-10-CM

## 2019-12-07 ENCOUNTER — Other Ambulatory Visit: Payer: Self-pay | Admitting: Family

## 2019-12-22 DIAGNOSIS — R69 Illness, unspecified: Secondary | ICD-10-CM | POA: Diagnosis not present

## 2019-12-23 DIAGNOSIS — R69 Illness, unspecified: Secondary | ICD-10-CM | POA: Diagnosis not present

## 2019-12-29 ENCOUNTER — Ambulatory Visit (HOSPITAL_COMMUNITY): Payer: Medicare HMO

## 2019-12-29 ENCOUNTER — Encounter (HOSPITAL_COMMUNITY): Payer: Self-pay

## 2020-01-17 DIAGNOSIS — M19011 Primary osteoarthritis, right shoulder: Secondary | ICD-10-CM | POA: Diagnosis not present

## 2020-01-17 DIAGNOSIS — S42191A Fracture of other part of scapula, right shoulder, initial encounter for closed fracture: Secondary | ICD-10-CM | POA: Diagnosis not present

## 2020-01-17 DIAGNOSIS — R600 Localized edema: Secondary | ICD-10-CM | POA: Diagnosis not present

## 2020-01-26 DIAGNOSIS — M25611 Stiffness of right shoulder, not elsewhere classified: Secondary | ICD-10-CM | POA: Diagnosis not present

## 2020-01-26 DIAGNOSIS — G8929 Other chronic pain: Secondary | ICD-10-CM | POA: Diagnosis not present

## 2020-01-26 DIAGNOSIS — M25511 Pain in right shoulder: Secondary | ICD-10-CM | POA: Diagnosis not present

## 2020-01-30 ENCOUNTER — Encounter (HOSPITAL_COMMUNITY): Payer: Self-pay

## 2020-01-30 ENCOUNTER — Ambulatory Visit (HOSPITAL_COMMUNITY): Payer: Medicare HMO

## 2020-02-02 ENCOUNTER — Other Ambulatory Visit: Payer: Self-pay

## 2020-02-02 ENCOUNTER — Encounter: Payer: Self-pay | Admitting: Physical Therapy

## 2020-02-02 ENCOUNTER — Ambulatory Visit: Payer: Medicare HMO | Attending: Orthopedic Surgery | Admitting: Physical Therapy

## 2020-02-02 DIAGNOSIS — M25511 Pain in right shoulder: Secondary | ICD-10-CM

## 2020-02-02 NOTE — Therapy (Signed)
[facility] [place], [place], [address] Phone: [phone]   Fax:  [phone]  Physical Therapy Evaluation  Patient Details  Name: Frances Morrison MRN: [id number] Date of Birth: [birth year] Referring Provider (PT): Vickey Huger, MD   Encounter Date: 02/02/2020  PT End of Session - 02/02/20 1548    Visit Number  1    Number of Visits  1    Date for PT Re-Evaluation  02/02/20    PT Start Time  0945    PT Stop Time  1020    PT Time Calculation (min)  35 min    Activity Tolerance  Patient tolerated treatment well    Behavior During Therapy  [facility] for tasks assessed/performed       Past Medical History:  Diagnosis Date  . Arthritis    arthritis -feet, hands knee  . Cervical cancer ([place])   . GERD (gastroesophageal reflux disease)   . Hypertension   . PONV (postoperative nausea and vomiting)     Past Surgical History:  Procedure Laterality Date  . ABDOMINAL HYSTERECTOMY    . APPENDECTOMY    . CATARACT EXTRACTION, BILATERAL Bilateral   . CHOLECYSTECTOMY    . TONSILLECTOMY    . TOTAL KNEE ARTHROPLASTY Right 07/25/2015   Procedure: TOTAL RIGHT KNEE ARTHROPLASTY;  Surgeon: Latanya Maudlin, MD;  Location: [facility];  Service: Orthopedics;  Laterality: Right;    There were no vitals filed for this visit.   Subjective Assessment - 02/02/20 1603    Subjective  COVID-19 screening performed upon arrival. Patient arrives to physical therapy with reports of right shoulder pain and decreased right shoulder ROM that began insidiously about two months ago. Patient reports difficulties with ADLs and increase of pain. Patient (-) for RTC per MRI. Patient would like HEP for right shoulder to improve movement, improve strength and ROM.    Pertinent History  Osteoporosis    Diagnostic tests  (-) RTC per MRI x-ray: arthritis and bone spur per patient report    Patient Stated Goals  learn HEP    Currently in Pain?  Yes    Pain Score  3      Pain Location  Shoulder    Pain Orientation  Right    Pain Descriptors / Indicators  Sore    Pain Type  Acute pain    Pain Onset  More than a month ago    Pain Frequency  Constant    Aggravating Factors   "moving in a certain way"    Pain Relieving Factors  "tylenol"    Effect of Pain on Daily Activities  no         [facility] PT Assessment - 02/02/20 0001      Assessment   Medical Diagnosis  Chronic right shoulder pain    Referring Provider (PT)  Vickey Huger, MD    Onset Date/Surgical Date  --   "two months"   Hand Dominance  Right    Next MD Visit  none    Prior Therapy  no      Precautions   Precautions  None      Restrictions   Weight Bearing Restrictions  No      Balance Screen   Has the patient fallen in the past 6 months  No    Has the patient had a decrease in activity level because of a fear of falling?   No    Is the patient reluctant to leave their home  because of a fear of falling?   No      Home Environment   Living Environment  Private residence      Prior Function   Level of Independence  Independent      Posture/Postural Control   Posture/Postural Control  Postural limitations    Postural Limitations  Rounded Shoulders;Forward head      ROM / Strength   AROM / PROM / Strength  AROM;Strength      AROM   Overall AROM   Deficits    AROM Assessment Site  Shoulder    Right/Left Shoulder  Right    Right Shoulder Flexion  130 Degrees    Right Shoulder ABduction  70 Degrees    Right Shoulder Internal Rotation  --   to abdomen   Right Shoulder External Rotation  40 Degrees      Strength   Overall Strength  Deficits;Due to pain    Strength Assessment Site  Shoulder    Right/Left Shoulder  Right    Right Shoulder Flexion  4-/5    Right Shoulder ABduction  3-/5    Right Shoulder Internal Rotation  4/5    Right Shoulder External Rotation  4/5      Palpation   Palpation comment  tenderness to palpation to lateral arm and right AC joint                 Objective measurements completed on examination: See above findings.                   PT Long Term Goals - 02/02/20 1558      PT LONG TERM GOAL #1   Title  Patient will be independent with HEP    Time  1    Period  Days    Status  Achieved             Plan - 02/02/20 2112    Clinical Impression Statement  Patient is a 79 year old female who presents to physical therapy with right shoulder pain, decreased right shoulder ROM, and decreased right shoulder MMT that began insidiously two months ago. Patient requested for HEP for ROM and strengthening. HEP discussed at length for form, technique, and frequency. Patient educated on how to modify HEP dependent on pain and how to progress per tolerance. Patient reported understanding. HEP printed and patient was instructed to call if any questions.    Personal Factors and Comorbidities  Comorbidity 1    Comorbidities  OP    Examination-Activity Limitations  Dressing    Stability/Clinical Decision Making  Stable/Uncomplicated    Clinical Decision Making  Low    Rehab Potential  Excellent    PT Frequency  One time visit    PT Treatment/Interventions  ADLs/Self Care Home Management;Patient/family education    PT Next Visit Plan  Eval only    PT Home Exercise Plan  see patient education section    Consulted and Agree with Plan of Care  Patient       Patient will benefit from skilled therapeutic intervention in order to improve the following deficits and impairments:  Pain, Postural dysfunction, Decreased strength, Decreased activity tolerance, Impaired UE functional use, Decreased range of motion  Visit Diagnosis: Acute pain of right shoulder - Plan: PT plan of care cert/re-cert     Problem List Patient Active Problem List   Diagnosis Date Noted  . Hypertension   . Arthritis   . GERD (gastroesophageal reflux disease)   .  Osteoporosis   . Vitamin D deficiency   . Obesity (BMI 30-39.9)   .  History of total knee arthroplasty 07/25/2015  . Mixed hyperlipidemia 09/13/2014    Gabriela Eves, PT, DPT 02/02/2020, 9:24 PM  [facility] [address] [place], [place], [address] Phone: [phone]   Fax:  [phone]  Name: Frances Morrison MRN: [id number] Date of Birth: [birth year]

## 2020-03-09 ENCOUNTER — Other Ambulatory Visit: Payer: Self-pay | Admitting: Family

## 2020-03-12 ENCOUNTER — Other Ambulatory Visit: Payer: Self-pay | Admitting: Family

## 2020-03-19 ENCOUNTER — Encounter: Payer: Self-pay | Admitting: *Deleted

## 2020-03-22 ENCOUNTER — Ambulatory Visit (INDEPENDENT_AMBULATORY_CARE_PROVIDER_SITE_OTHER): Payer: Medicare HMO | Admitting: Family

## 2020-03-22 ENCOUNTER — Encounter: Payer: Self-pay | Admitting: Family

## 2020-03-22 VITALS — BP 127/71

## 2020-03-22 DIAGNOSIS — E669 Obesity, unspecified: Secondary | ICD-10-CM | POA: Diagnosis not present

## 2020-03-22 DIAGNOSIS — K219 Gastro-esophageal reflux disease without esophagitis: Secondary | ICD-10-CM

## 2020-03-22 DIAGNOSIS — M810 Age-related osteoporosis without current pathological fracture: Secondary | ICD-10-CM

## 2020-03-22 DIAGNOSIS — M1990 Unspecified osteoarthritis, unspecified site: Secondary | ICD-10-CM

## 2020-03-22 DIAGNOSIS — E782 Mixed hyperlipidemia: Secondary | ICD-10-CM | POA: Diagnosis not present

## 2020-03-22 DIAGNOSIS — I10 Essential (primary) hypertension: Secondary | ICD-10-CM | POA: Diagnosis not present

## 2020-03-22 NOTE — Progress Notes (Signed)
° °Virtual Visit via telephone Note °Due to COVID-19 pandemic this visit was conducted virtually. This visit type was conducted due to national recommendations for restrictions regarding the COVID-19 Pandemic (e.g. social distancing, sheltering in place) in an effort to limit this patient's exposure and mitigate transmission in our community. All issues noted in this document were discussed and addressed.  A physical exam was not performed with this format. ° °I connected with Frances Morrison on 03/22/20 at 8:35 AM by telephone and verified that I am speaking with the correct person using two identifiers. Frances Morrison is currently located at home and no one is currently with her during visit. The provider, Christy Hawks, FNP is located in their office at time of visit. ° °I discussed the limitations, risks, security and privacy concerns of performing an evaluation and management service by telephone and the availability of in person appointments. I also discussed with the patient that there may be a patient responsible charge related to this service. The patient expressed understanding and agreed to proceed. ° ° °History and Present Illness: ° °Pt calls the office today for chronic follow up. 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 She reports she stays "on the go".  °Hypertension °This is a chronic problem. The current episode started more than 1 year ago. The problem has been resolved since onset. The problem is controlled. Pertinent negatives include no malaise/fatigue, peripheral edema or shortness of breath. Risk factors for coronary artery disease include dyslipidemia, obesity and sedentary lifestyle. Past treatments include calcium channel blockers. The current treatment provides moderate improvement. There is no history of kidney disease, CAD/MI or heart failure.  °Gastroesophageal Reflux °She complains of belching and heartburn. This is a chronic problem.  The current episode started more than 1 year ago. The problem occurs occasionally. She has tried a PPI for the symptoms. The treatment provided moderate relief.  °Arthritis °Presents for follow-up visit. She complains of pain and stiffness. The symptoms have been stable. Affected locations include the left MCP, right MCP, left foot, right foot, left shoulder and right shoulder. Her pain is at a severity of 4/10.  °Hyperlipidemia °This is a chronic problem. The current episode started more than 1 year ago. The problem is uncontrolled. Pertinent negatives include no shortness of breath. Current antihyperlipidemic treatment includes diet change. The current treatment provides mild improvement of lipids. Risk factors for coronary artery disease include dyslipidemia, hypertension and a sedentary lifestyle.  °Osteoporosis °PT taking Fosamax weekly.  ° ° ° °Review of Systems  °Constitutional: Negative for malaise/fatigue.  °Respiratory: Negative for shortness of breath.   °Gastrointestinal: Positive for heartburn.  °Musculoskeletal: Positive for arthritis and stiffness.  ° ° ° °Observations/Objective: °No SOB or distress noted ° °Assessment and Plan: °James W Yaklin comes in today with chief complaint of No chief complaint on file. ° ° °Diagnosis and orders addressed: ° °1. Essential hypertension °- CMP14+EGFR; Future °- CBC with Differential/Platelet; Future ° °2. Gastroesophageal reflux disease, unspecified whether esophagitis present °- CMP14+EGFR; Future °- CBC with Differential/Platelet; Future ° °3. Arthritis °- CMP14+EGFR; Future °- CBC with Differential/Platelet; Future ° °4. Osteoporosis, unspecified osteoporosis type, unspecified pathological fracture presence °- CMP14+EGFR; Future °- CBC with Differential/Platelet; Future ° °5. Mixed hyperlipidemia °- CMP14+EGFR; Future °- CBC with Differential/Platelet; Future °- Lipid panel; Future ° °6. Obesity (BMI 30-39.9) °- CMP14+EGFR; Future °- CBC with  Differential/Platelet; Future ° ° °Labs pending °Health Maintenance reviewed °Diet   and exercise encouraged ° °Follow up plan: °6 months  ° ° ° °  °I discussed the assessment and treatment plan with the patient. The patient was provided an opportunity to ask questions and all were answered. The patient agreed with the plan and demonstrated an understanding of the instructions. °  °The patient was advised to call back or seek an in-person evaluation if the symptoms worsen or if the condition fails to improve as anticipated. ° °The above assessment and management plan was discussed with the patient. The patient verbalized understanding of and has agreed to the management plan. Patient is aware to call the clinic if symptoms persist or worsen. Patient is aware when to return to the clinic for a follow-up visit. Patient educated on when it is appropriate to go to the emergency department.  ° °Time call ended:  8:55 AM  ° °I provided 20 minutes of non-face-to-face time during this encounter. ° ° ° °Christy Hawks, FNP ° ° °

## 2020-03-28 DIAGNOSIS — K219 Gastro-esophageal reflux disease without esophagitis: Secondary | ICD-10-CM | POA: Diagnosis not present

## 2020-03-28 DIAGNOSIS — Z008 Encounter for other general examination: Secondary | ICD-10-CM | POA: Diagnosis not present

## 2020-03-28 DIAGNOSIS — M810 Age-related osteoporosis without current pathological fracture: Secondary | ICD-10-CM | POA: Diagnosis not present

## 2020-03-28 DIAGNOSIS — R32 Unspecified urinary incontinence: Secondary | ICD-10-CM | POA: Diagnosis not present

## 2020-03-28 DIAGNOSIS — Z6830 Body mass index (BMI) 30.0-30.9, adult: Secondary | ICD-10-CM | POA: Diagnosis not present

## 2020-03-28 DIAGNOSIS — Z791 Long term (current) use of non-steroidal anti-inflammatories (NSAID): Secondary | ICD-10-CM | POA: Diagnosis not present

## 2020-03-28 DIAGNOSIS — G629 Polyneuropathy, unspecified: Secondary | ICD-10-CM | POA: Diagnosis not present

## 2020-03-28 DIAGNOSIS — M1990 Unspecified osteoarthritis, unspecified site: Secondary | ICD-10-CM | POA: Diagnosis not present

## 2020-03-28 DIAGNOSIS — I10 Essential (primary) hypertension: Secondary | ICD-10-CM | POA: Diagnosis not present

## 2020-03-28 DIAGNOSIS — G8929 Other chronic pain: Secondary | ICD-10-CM | POA: Diagnosis not present

## 2020-03-28 DIAGNOSIS — E669 Obesity, unspecified: Secondary | ICD-10-CM | POA: Diagnosis not present

## 2020-03-29 ENCOUNTER — Other Ambulatory Visit: Payer: Self-pay

## 2020-03-29 ENCOUNTER — Other Ambulatory Visit: Payer: Medicare HMO

## 2020-03-29 ENCOUNTER — Ambulatory Visit: Payer: Medicare HMO | Admitting: Family

## 2020-03-29 DIAGNOSIS — K219 Gastro-esophageal reflux disease without esophagitis: Secondary | ICD-10-CM

## 2020-03-29 DIAGNOSIS — E669 Obesity, unspecified: Secondary | ICD-10-CM | POA: Diagnosis not present

## 2020-03-29 DIAGNOSIS — E782 Mixed hyperlipidemia: Secondary | ICD-10-CM

## 2020-03-29 DIAGNOSIS — M1990 Unspecified osteoarthritis, unspecified site: Secondary | ICD-10-CM | POA: Diagnosis not present

## 2020-03-29 DIAGNOSIS — M810 Age-related osteoporosis without current pathological fracture: Secondary | ICD-10-CM

## 2020-03-29 DIAGNOSIS — I10 Essential (primary) hypertension: Secondary | ICD-10-CM | POA: Diagnosis not present

## 2020-03-30 LAB — CMP14+EGFR
ALT: 16 IU/L (ref 0–32)
AST: 25 IU/L (ref 0–40)
Albumin/Globulin Ratio: 1.6 (ref 1.2–2.2)
Albumin: 4.6 g/dL (ref 3.7–4.7)
Alkaline Phosphatase: 97 IU/L (ref 48–121)
BUN/Creatinine Ratio: 21 (ref 12–28)
BUN: 18 mg/dL (ref 8–27)
Bilirubin Total: 0.2 mg/dL (ref 0.0–1.2)
CO2: 25 mmol/L (ref 20–29)
Calcium: 9.4 mg/dL (ref 8.7–10.3)
Chloride: 101 mmol/L (ref 96–106)
Creatinine, Ser: 0.85 mg/dL (ref 0.57–1.00)
GFR calc Af Amer: 75 mL/min/{1.73_m2} (ref >59)
GFR calc non Af Amer: 65 mL/min/{1.73_m2} (ref >59)
Globulin, Total: 2.9 g/dL (ref 1.5–4.5)
Glucose: 102 mg/dL — ABNORMAL HIGH (ref 65–99)
Potassium: 4.2 mmol/L (ref 3.5–5.2)
Sodium: 141 mmol/L (ref 134–144)
Total Protein: 7.5 g/dL (ref 6.0–8.5)

## 2020-03-30 LAB — CBC WITH DIFFERENTIAL/PLATELET
Basophils Absolute: 0.1 10*3/uL (ref 0.0–0.2)
Basos: 1 %
EOS (ABSOLUTE): 0.2 10*3/uL (ref 0.0–0.4)
Eos: 3 %
Hematocrit: 36.1 % (ref 34.0–46.6)
Hemoglobin: 11.5 g/dL (ref 11.1–15.9)
Immature Grans (Abs): 0 10*3/uL (ref 0.0–0.1)
Immature Granulocytes: 0 %
Lymphocytes Absolute: 1.7 10*3/uL (ref 0.7–3.1)
Lymphs: 26 %
MCH: 26 pg — ABNORMAL LOW (ref 26.6–33.0)
MCHC: 31.9 g/dL (ref 31.5–35.7)
MCV: 82 fL (ref 79–97)
Monocytes Absolute: 0.6 10*3/uL (ref 0.1–0.9)
Monocytes: 9 %
Neutrophils Absolute: 4.1 10*3/uL (ref 1.4–7.0)
Neutrophils: 61 %
Platelets: 226 10*3/uL (ref 150–450)
RBC: 4.42 x10E6/uL (ref 3.77–5.28)
RDW: 15.4 % (ref 11.7–15.4)
WBC: 6.6 10*3/uL (ref 3.4–10.8)

## 2020-03-30 LAB — LIPID PANEL
Chol/HDL Ratio: 3 ratio (ref 0.0–4.4)
Cholesterol, Total: 204 mg/dL — ABNORMAL HIGH (ref 100–199)
HDL: 68 mg/dL (ref >39)
LDL Chol Calc (NIH): 117 mg/dL — ABNORMAL HIGH (ref 0–99)
Triglycerides: 106 mg/dL (ref 0–149)
VLDL Cholesterol Cal: 19 mg/dL (ref 5–40)

## 2020-06-07 ENCOUNTER — Other Ambulatory Visit: Payer: Self-pay | Admitting: Family

## 2020-06-18 ENCOUNTER — Other Ambulatory Visit: Payer: Self-pay

## 2020-06-18 ENCOUNTER — Encounter: Payer: Self-pay | Admitting: Family Medicine

## 2020-06-18 ENCOUNTER — Ambulatory Visit (INDEPENDENT_AMBULATORY_CARE_PROVIDER_SITE_OTHER): Payer: Medicare HMO | Admitting: Family Medicine

## 2020-06-18 VITALS — BP 167/80 | HR 76 | Temp 97.0°F | Ht 63.0 in | Wt 170.0 lb

## 2020-06-18 DIAGNOSIS — L301 Dyshidrosis [pompholyx]: Secondary | ICD-10-CM | POA: Diagnosis not present

## 2020-06-18 DIAGNOSIS — T148XXA Other injury of unspecified body region, initial encounter: Secondary | ICD-10-CM

## 2020-06-18 DIAGNOSIS — S50911A Unspecified superficial injury of right forearm, initial encounter: Secondary | ICD-10-CM | POA: Diagnosis not present

## 2020-06-18 DIAGNOSIS — L03114 Cellulitis of left upper limb: Secondary | ICD-10-CM

## 2020-06-18 MED ORDER — AMOXICILLIN-POT CLAVULANATE 875-125 MG PO TABS
1.0000 | ORAL_TABLET | Freq: Two times a day (BID) | ORAL | 0 refills | Status: DC
Start: 2020-06-18 — End: 2020-10-18

## 2020-06-18 NOTE — Progress Notes (Signed)
BP (!) 167/80   Pulse 76   Temp (!) 97 F (36.1 C)   Ht 5\' 3"  (1.6 m)   Wt 170 lb (77.1 kg)   SpO2 96%   BMI 30.11 kg/m    Subjective:   Patient ID: Frances Morrison, female    DOB: July 14, 1941, 79 y.o.   MRN: [id number]  HPI: Frances Morrison is a 79 y.o. female presenting on 06/18/2020 for arm rash.  HPI  Right Forearm: She injured her right forearm after "banging it" on the inside of the puppy cage when cleaning along with hitting it at the laundry mat  About 4 weeks ago. Patient state that she broke the skin and has been trying to keep things clean and protected while she is out. Patient states she has been putting hydrocortisone because this forearm has been itching and Vaseline to help with the healing. She states things have been improving but she is still noticing some weeping from the open wound and itching. Patient denies any changes in her detergents or soaps. She has very sensitive skin and has had to take a benadryl on occasion to help her fall asleep due to this itching.  She denies any pain but mostly just complains of the itching and says that the itching has not been improving and she scratched herself overnight and now has a new rash just above where she was itching on the forearm.  Left Forearm: Patient states she has a 27 week old puppy who bit her on her left arm 3 weeks ago. Patient states it really has not healed since then. She states she did not notice her dog bite her but states she was hold him and then a few hours later she started developing a red spot on her arm. She states she has been putting hydrocortisone cream and Vaseline on this spot but it really has not improved.  She states she has never had an allergy to dogs before.  She denies any significant pain with it but just says is more irritated.  Itching:  Patient states in the past 3 days she has noticed increased itching on her neck, back, and palms of her hands. She states she has started to notice spots develop  on the palmar aspect of her hands bilaterally along with in the webspace of her fingers. She states she is able to get some relief by placing ice packs between her palms and has been using hydrocortizone cream on her neck and back. She states that she has had some mild issues sleeping due to the issues but has taken a Benadryl, which seems to help her relax and sleep.   Relevant past medical, surgical, family and social history reviewed and updated as indicated. Interim medical history since our last visit reviewed. Allergies and medications reviewed and updated.  Review of Systems  Constitutional: Negative for chills and fever.  Respiratory: Negative for cough and shortness of breath.   Cardiovascular: Negative for chest pain.  Gastrointestinal: Negative for diarrhea, nausea and vomiting.  Skin: Positive for color change, rash and wound.  Neurological: Negative for weakness.    Per HPI unless specifically indicated above   Allergies as of 06/18/2020      Reactions   Codeine Hives   Latex Other (See Comments)   Statins    Muscle  pain   Sulfa Antibiotics    Eye cream caused swelling and irritated.   Betadine [povidone Iodine] Rash      Medication  List       Accurate as of June 18, 2020  1:26 PM. If you have any questions, ask your nurse or doctor.        alendronate 70 MG tablet Commonly known as: FOSAMAX TAKE 1 TABELT ONCE A WEEK   amLODipine 5 MG tablet Commonly known as: NORVASC TAKE 1 TABLET ONCE DAILY   amoxicillin-clavulanate 875-125 MG tablet Commonly known as: AUGMENTIN Take 1 tablet by mouth 2 (two) times daily. Started by: Worthy Rancher, MD   Centrum Silver 50+Women Tabs Take by mouth daily.   esomeprazole 40 MG capsule Commonly known as: NEXIUM TAKE 1 CAPSULE DAILY BEFORE BREAKFAST   meloxicam 7.5 MG tablet Commonly known as: MOBIC TAKE 1 TABLET DAILY   TYLENOL ARTHRITIS PAIN PO Take by mouth as needed.        Objective:   BP (!)  167/80   Pulse 76   Temp (!) 97 F (36.1 C)   Ht 5\' 3"  (1.6 m)   Wt 170 lb (77.1 kg)   SpO2 96%   BMI 30.11 kg/m   Wt Readings from Last 3 Encounters:  06/18/20 170 lb (77.1 kg)  03/29/19 170 lb 12.8 oz (77.5 kg)  07/25/15 170 lb (77.1 kg)    Physical Exam Constitutional:      Appearance: Normal appearance.  Cardiovascular:     Heart sounds: No murmur heard.  No friction rub.  Pulmonary:     Effort: Pulmonary effort is normal.     Breath sounds: Normal breath sounds.  Skin:    General: Skin is warm.     Findings: Erythema, lesion and rash present.     Comments: Left posterior forearm: honey clustering over puncture site, mild erythema, warmth to touch, erythema is about 2 cm in diameter, no fluctuation or induration noted. Right posterior forearm: mild abrasion, erythema, no swelling about 2-1/2 cm in diameter Palms: small clear filled blisters on palms and in webspace of the first and second digits.  Neck and back: mild abrasive rash   Neurological:     Mental Status: She is alert.     Assessment & Plan:  Assessment:  1. Abrasion of right forearm  2. Cellulits secondary to dog bite 3. Dyshidrotic eczema   Plan: Patient's condition was discussed with him and the following plan was determined:   1. Augmentin 875-125mg  twice a day by mouth for 10 days  2. Continue placing Vaseline on bilateral forearms  3. Continue using Hydrocortisone on right forearm, back, neck and palms of hands. Patient given Xeroform gauze sample for right forearm open wound.  Plan was discussed with Dr. Caryl Pina, MD who is in agreement with stated plan.   Maxie Better PA-S   Problem List Items Addressed This Visit    None    Visit Diagnoses    Dyshidrotic eczema    -  Primary   Cellulitis of left upper extremity       Abrasion           Follow up plan: Return if symptoms worsen or fail to improve.  Counseling provided for all of the vaccine components No orders  of the defined types were placed in this encounter.  Patient seen and examined with Maxie Better, PA student, agree with assessment and plan above.  We will give doxycycline for cellulitis on left forearm, recommended that she could use hydrocortisone on her rash on her hands and right forearm along with Vaseline on both arms to help  with moisturization.  Caryl Pina, MD [facility] 06/18/2020, 1:26 PM

## 2020-06-26 ENCOUNTER — Telehealth: Payer: Self-pay | Admitting: Family

## 2020-06-26 DIAGNOSIS — B354 Tinea corporis: Secondary | ICD-10-CM | POA: Diagnosis not present

## 2020-06-26 DIAGNOSIS — R21 Rash and other nonspecific skin eruption: Secondary | ICD-10-CM | POA: Diagnosis not present

## 2020-06-26 NOTE — Telephone Encounter (Signed)
Pt states that rash has not improved. The itching is worse and has new bumps on chest. Augmentin and HC 1% cream is not helping. Informed pt that the ATB was because of the dog bite to prevent infection.  Pt would like Prednisone or to be seen in the office by Alyse Low or Dettinger tomorrow. Informed pt that I am not sure that she needs Prednisone and that it is not a medication that can be prescribed sparingly. Also informed pt that there are not any openings until the end of the week for Chrsity or Dettinger. She became upset and said she would call the urgent care and then hung the phone up.

## 2020-07-11 DIAGNOSIS — L309 Dermatitis, unspecified: Secondary | ICD-10-CM | POA: Diagnosis not present

## 2020-07-11 DIAGNOSIS — D485 Neoplasm of uncertain behavior of skin: Secondary | ICD-10-CM | POA: Diagnosis not present

## 2020-07-25 DIAGNOSIS — L2082 Flexural eczema: Secondary | ICD-10-CM | POA: Diagnosis not present

## 2020-08-08 DIAGNOSIS — Z1231 Encounter for screening mammogram for malignant neoplasm of breast: Secondary | ICD-10-CM | POA: Diagnosis not present

## 2020-08-13 DIAGNOSIS — R69 Illness, unspecified: Secondary | ICD-10-CM | POA: Diagnosis not present

## 2020-09-10 ENCOUNTER — Other Ambulatory Visit: Payer: Self-pay | Admitting: Family

## 2020-10-18 ENCOUNTER — Ambulatory Visit (INDEPENDENT_AMBULATORY_CARE_PROVIDER_SITE_OTHER): Payer: Medicare HMO

## 2020-10-18 ENCOUNTER — Encounter: Payer: Self-pay | Admitting: Family

## 2020-10-18 ENCOUNTER — Ambulatory Visit (INDEPENDENT_AMBULATORY_CARE_PROVIDER_SITE_OTHER): Payer: Medicare HMO | Admitting: Family

## 2020-10-18 ENCOUNTER — Other Ambulatory Visit: Payer: Self-pay

## 2020-10-18 VITALS — BP 128/70 | HR 69 | Temp 97.3°F | Ht 63.0 in | Wt 173.2 lb

## 2020-10-18 DIAGNOSIS — M810 Age-related osteoporosis without current pathological fracture: Secondary | ICD-10-CM

## 2020-10-18 DIAGNOSIS — I10 Essential (primary) hypertension: Secondary | ICD-10-CM

## 2020-10-18 DIAGNOSIS — K219 Gastro-esophageal reflux disease without esophagitis: Secondary | ICD-10-CM

## 2020-10-18 DIAGNOSIS — E782 Mixed hyperlipidemia: Secondary | ICD-10-CM | POA: Diagnosis not present

## 2020-10-18 DIAGNOSIS — M85851 Other specified disorders of bone density and structure, right thigh: Secondary | ICD-10-CM | POA: Diagnosis not present

## 2020-10-18 DIAGNOSIS — Z780 Asymptomatic menopausal state: Secondary | ICD-10-CM | POA: Diagnosis not present

## 2020-10-18 DIAGNOSIS — E559 Vitamin D deficiency, unspecified: Secondary | ICD-10-CM | POA: Diagnosis not present

## 2020-10-18 DIAGNOSIS — E669 Obesity, unspecified: Secondary | ICD-10-CM

## 2020-10-18 DIAGNOSIS — M1990 Unspecified osteoarthritis, unspecified site: Secondary | ICD-10-CM | POA: Diagnosis not present

## 2020-10-18 MED ORDER — ESOMEPRAZOLE MAGNESIUM 40 MG PO CPDR: 40.0000 mg | DELAYED_RELEASE_CAPSULE | Freq: Every day | ORAL | 1 refills | Status: DC

## 2020-10-18 MED ORDER — AMLODIPINE BESYLATE 5 MG PO TABS: 5.0000 mg | ORAL_TABLET | Freq: Every day | ORAL | 1 refills | Status: DC

## 2020-10-18 MED ORDER — MELOXICAM 7.5 MG PO TABS: 7.5000 mg | ORAL_TABLET | Freq: Every day | ORAL | 1 refills | Status: DC

## 2020-10-18 MED ORDER — ALENDRONATE SODIUM 70 MG PO TABS: ORAL_TABLET | ORAL | 0 refills | Status: DC

## 2020-10-18 NOTE — Progress Notes (Addendum)
Subjective:    Patient ID: Frances Morrison, female    DOB: 1941-02-22, 80 y.o.   MRN: [id number]  Chief Complaint  Patient presents with  . Hypertension    6 mth, not fasting    Pt calls the office today for chronic follow up.  Hypertension This is a chronic problem. The current episode started more than 1 year ago. The problem has been resolved since onset. The problem is uncontrolled. Pertinent negatives include no malaise/fatigue, peripheral edema or shortness of breath. Risk factors for coronary artery disease include dyslipidemia, obesity and sedentary lifestyle. There is no history of CVA or heart failure.  Gastroesophageal Reflux She complains of heartburn. She reports no belching. This is a chronic problem. The current episode started more than 1 year ago. The problem occurs rarely. The symptoms are aggravated by certain foods. She has tried a PPI for the symptoms. The treatment provided moderate relief.  Arthritis Presents for follow-up visit. She complains of pain and stiffness. The symptoms have been stable. Affected locations include the left MCP, right MCP, left knee and right knee. Her pain is at a severity of 5/10.  Hyperlipidemia This is a chronic problem. The current episode started more than 1 year ago. The problem is uncontrolled. Recent lipid tests were reviewed and are high. Exacerbating diseases include obesity. Pertinent negatives include no shortness of breath. Current antihyperlipidemic treatment includes diet change. The current treatment provides mild improvement of lipids.   Osteoporosis  She does not currently take calcium or Vit D or weight bearing exercises. She does take Fosamax 70 mg weekly.   Review of Systems  Constitutional: Negative for malaise/fatigue.  Respiratory: Negative for shortness of breath.   Gastrointestinal: Positive for heartburn.  Musculoskeletal: Positive for arthritis and stiffness.  All other systems reviewed and are negative.       Objective:   Physical Exam Vitals reviewed.  Constitutional:      General: She is not in acute distress.    Appearance: She is well-developed and well-nourished.  HENT:     Head: Normocephalic and atraumatic.     Right Ear: Tympanic membrane normal.     Left Ear: Tympanic membrane normal.     Mouth/Throat:     Mouth: Oropharynx is clear and moist.  Eyes:     Pupils: Pupils are equal, round, and reactive to light.  Neck:     Thyroid: No thyromegaly.  Cardiovascular:     Rate and Rhythm: Normal rate and regular rhythm.     Pulses: Intact distal pulses.     Heart sounds: Normal heart sounds. No murmur heard.   Pulmonary:     Effort: Pulmonary effort is normal. No respiratory distress.     Breath sounds: Normal breath sounds. No wheezing.  Abdominal:     General: Bowel sounds are normal. There is no distension.     Palpations: Abdomen is soft.     Tenderness: There is no abdominal tenderness.  Musculoskeletal:        General: No tenderness or edema. Normal range of motion.     Cervical back: Normal range of motion and neck supple.  Skin:    General: Skin is warm and dry.  Neurological:     Mental Status: She is alert and oriented to person, place, and time.     Cranial Nerves: No cranial nerve deficit.     Deep Tendon Reflexes: Reflexes are normal and symmetric.  Psychiatric:        Mood  and Affect: Mood and affect normal.        Behavior: Behavior normal.        Thought Content: Thought content normal.        Judgment: Judgment normal.       BP 128/70 Comment: per patient at home  Pulse 69   Temp (!) 97.3 F (36.3 C) (Temporal)   Ht '5\' 3"'  (1.6 m)   Wt 173 lb 3.2 oz (78.6 kg)   SpO2 98%   BMI 30.68 kg/m      Assessment & Plan:  SHALA BAUMBACH comes in today with chief complaint of Hypertension (6 mth, not fasting )   Diagnosis and orders addressed:  1. Primary hypertension -Pt will check BP at home and call us  -Dash diet information given -Exercise  encouraged - Stress Management  -Continue current meds - amLODipine (NORVASC) 5 MG tablet; Take 1 tablet (5 mg total) by mouth daily. (Needs to be seen before next refill)  Dispense: 90 tablet; Refill: 1 - CMP14+EGFR - CBC with Differential/Platelet  2. Gastroesophageal reflux disease, unspecified whether esophagitis present - esomeprazole (NEXIUM) 40 MG capsule; Take 1 capsule (40 mg total) by mouth daily before breakfast. (Needs to be seen before next refill)  Dispense: 90 capsule; Refill: 1 - CMP14+EGFR - CBC with Differential/Platelet  3. Arthritis - meloxicam (MOBIC) 7.5 MG tablet; Take 1 tablet (7.5 mg total) by mouth daily. (Needs to be seen before next refill)  Dispense: 90 tablet; Refill: 1 - CMP14+EGFR - CBC with Differential/Platelet  4. Mixed hyperlipidemia - CMP14+EGFR - CBC with Differential/Platelet  5. Obesity (BMI 30-39.9) - CMP14+EGFR - CBC with Differential/Platelet  6. Vitamin D deficiency - CMP14+EGFR - CBC with Differential/Platelet  7. Osteoporosis, unspecified osteoporosis type, unspecified pathological fracture presence - alendronate (FOSAMAX) 70 MG tablet; TAKE 1 TABELT ONCE A WEEK  Dispense: 12 tablet; Refill: 0 - CMP14+EGFR - CBC with Differential/Platelet - DG [facility] DEXA   Labs pending Health Maintenance reviewed Diet and exercise encouraged  Follow up plan: 6 months    [name], FNP

## 2020-10-18 NOTE — Patient Instructions (Signed)
Osteoporosis  Osteoporosis happens when the bones become thin and less dense than normal. Osteoporosis makes bones more brittle and fragile and more likely to break (fracture). Over time, osteoporosis can cause your bones to become so weak that they fracture after a minor fall. Bones in the hip, wrist, and spine are most likely to fracture due to osteoporosis. What are the causes? The exact cause of this condition is not known. What increases the risk? You are more likely to develop this condition if you:  Have family members with this condition.  Have poor nutrition.  Use the following: ? Steroid medicines, such as prednisone. ? Anti-seizure medicines. ? Nicotine or tobacco, such as cigarettes, e-cigarettes, and chewing tobacco.  Are female.  Are age 50 or older.  Are not physically active (are sedentary).  Are of European or Asian descent.  Have a small body frame. What are the signs or symptoms? A fracture might be the first sign of osteoporosis, especially if the fracture results from a fall or injury that usually would not cause a bone to break. Other signs and symptoms include:  Pain in the neck or low back.  Stooped posture.  Loss of height. How is this diagnosed? This condition may be diagnosed based on:  Your medical history.  A physical exam.  A bone mineral density test, also called a DXA or DEXA test (dual-energy X-ray absorptiometry test). This test uses X-rays to measure the amount of minerals in your bones. How is this treated? This condition may be treated by:  Making lifestyle changes, such as: ? Including foods with more calcium and vitamin D in your diet. ? Doing weight-bearing and muscle-strengthening exercises. ? Stopping tobacco use. ? Limiting alcohol intake.  Taking medicine to slow the process of bone loss or to increase bone density.  Taking daily supplements of calcium and vitamin D.  Taking hormone replacement medicines, such as  estrogen for women and testosterone for men.  Monitoring your levels of calcium and vitamin D. The goal of treatment is to strengthen your bones and lower your risk for a fracture. Follow these instructions at home: Eating and drinking Include calcium and vitamin D in your diet. Calcium is important for bone health, and vitamin D helps your body absorb calcium. Good sources of calcium and vitamin D include:  Certain fatty fish, such as salmon and tuna.  Products that have calcium and vitamin D added to them (are fortified), such as fortified cereals.  Egg yolks.  Cheese.  Liver.   Activity Do exercises as told by your health care provider. Ask your health care provider what exercises and activities are safe for you. You should do:  Exercises that make you work against gravity (weight-bearing exercises), such as tai chi, yoga, or walking.  Exercises to strengthen muscles, such as lifting weights. Lifestyle  Do not drink alcohol if: ? Your health care provider tells you not to drink. ? You are pregnant, may be pregnant, or are planning to become pregnant.  If you drink alcohol: ? Limit how much you use to:  0-1 drink a day for women.  0-2 drinks a day for men.  Know how much alcohol is in your drink. In the [place] one drink equals one 12 oz bottle of beer (355 mL), one 5 oz glass of wine (148 mL), or one 1 oz glass of hard liquor (44 mL).  Do not use any products that contain nicotine or tobacco, such as cigarettes, e-cigarettes, and chewing tobacco.   If you need help quitting, ask your health care provider. Preventing falls  Use devices to help you move around (mobility aids) as needed, such as canes, walkers, scooters, or crutches.  Keep rooms well-lit and clutter-free.  Remove tripping hazards from walkways, including cords and throw rugs.  Install grab bars in bathrooms and safety rails on stairs.  Use rubber mats in the bathroom and other areas that are often wet or  slippery.  Wear closed-toe shoes that fit well and support your feet. Wear shoes that have rubber soles or low heels.  Review your medicines with your health care provider. Some medicines can cause dizziness or changes in blood pressure, which can increase your risk of falling. General instructions  Take over-the-counter and prescription medicines only as told by your health care provider.  Keep all follow-up visits. This is important. Contact a health care provider if:  You have never been screened for osteoporosis and you are: ? A woman who is age 65 or older. ? A man who is age 70 or older. Get help right away if:  You fall or injure yourself. Summary  Osteoporosis is thinning and loss of density in your bones. This makes bones more brittle and fragile and more likely to break (fracture),even with minor falls.  The goal of treatment is to strengthen your bones and lower your risk for a fracture.  Include calcium and vitamin D in your diet. Calcium is important for bone health, and vitamin D helps your body absorb calcium.  Talk with your health care provider about screening for osteoporosis if you are a woman who is age 65 or older, or a man who is age 70 or older. This information is not intended to replace advice given to you by your health care provider. Make sure you discuss any questions you have with your health care provider. Document Revised: 03/08/2020 Document Reviewed: 03/08/2020 Elsevier Patient Education  2021 [employer]  

## 2020-10-19 LAB — CMP14+EGFR
ALT: 17 IU/L (ref 0–32)
AST: 27 IU/L (ref 0–40)
Albumin/Globulin Ratio: 1.7 (ref 1.2–2.2)
Albumin: 4.5 g/dL (ref 3.7–4.7)
Alkaline Phosphatase: 88 IU/L (ref 44–121)
BUN/Creatinine Ratio: 21 (ref 12–28)
BUN: 19 mg/dL (ref 8–27)
Bilirubin Total: 0.2 mg/dL (ref 0.0–1.2)
CO2: 25 mmol/L (ref 20–29)
Calcium: 9.4 mg/dL (ref 8.7–10.3)
Chloride: 103 mmol/L (ref 96–106)
Creatinine, Ser: 0.9 mg/dL (ref 0.57–1.00)
GFR calc Af Amer: 70 mL/min/{1.73_m2} (ref >59)
GFR calc non Af Amer: 61 mL/min/{1.73_m2} (ref >59)
Globulin, Total: 2.6 g/dL (ref 1.5–4.5)
Glucose: 99 mg/dL (ref 65–99)
Potassium: 4.3 mmol/L (ref 3.5–5.2)
Sodium: 142 mmol/L (ref 134–144)
Total Protein: 7.1 g/dL (ref 6.0–8.5)

## 2020-10-19 LAB — CBC WITH DIFFERENTIAL/PLATELET
Basophils Absolute: 0 10*3/uL (ref 0.0–0.2)
Basos: 1 %
EOS (ABSOLUTE): 0.2 10*3/uL (ref 0.0–0.4)
Eos: 3 %
Hematocrit: 38.1 % (ref 34.0–46.6)
Hemoglobin: 12.4 g/dL (ref 11.1–15.9)
Immature Grans (Abs): 0 10*3/uL (ref 0.0–0.1)
Immature Granulocytes: 0 %
Lymphocytes Absolute: 1.5 10*3/uL (ref 0.7–3.1)
Lymphs: 25 %
MCH: 27.8 pg (ref 26.6–33.0)
MCHC: 32.5 g/dL (ref 31.5–35.7)
MCV: 85 fL (ref 79–97)
Monocytes Absolute: 0.5 10*3/uL (ref 0.1–0.9)
Monocytes: 8 %
Neutrophils Absolute: 3.8 10*3/uL (ref 1.4–7.0)
Neutrophils: 63 %
Platelets: 209 10*3/uL (ref 150–450)
RBC: 4.46 x10E6/uL (ref 3.77–5.28)
RDW: 13.6 % (ref 11.7–15.4)
WBC: 6.1 10*3/uL (ref 3.4–10.8)

## 2020-10-24 ENCOUNTER — Telehealth: Payer: Self-pay

## 2020-10-24 NOTE — Telephone Encounter (Signed)
Pt calling in bp reading for [facility]  Monday am--128/70 Tuesday am--135/76 Wed am--130/74

## 2020-10-25 NOTE — Telephone Encounter (Signed)
Ok, great.

## 2020-11-27 DIAGNOSIS — D0461 Carcinoma in situ of skin of right upper limb, including shoulder: Secondary | ICD-10-CM | POA: Diagnosis not present

## 2020-11-27 DIAGNOSIS — L570 Actinic keratosis: Secondary | ICD-10-CM | POA: Diagnosis not present

## 2020-11-27 DIAGNOSIS — D485 Neoplasm of uncertain behavior of skin: Secondary | ICD-10-CM | POA: Diagnosis not present

## 2020-11-27 DIAGNOSIS — L309 Dermatitis, unspecified: Secondary | ICD-10-CM | POA: Diagnosis not present

## 2020-12-13 DIAGNOSIS — C44622 Squamous cell carcinoma of skin of right upper limb, including shoulder: Secondary | ICD-10-CM | POA: Diagnosis not present

## 2020-12-13 DIAGNOSIS — L570 Actinic keratosis: Secondary | ICD-10-CM | POA: Diagnosis not present

## 2021-01-03 DIAGNOSIS — Z6830 Body mass index (BMI) 30.0-30.9, adult: Secondary | ICD-10-CM | POA: Diagnosis not present

## 2021-01-03 DIAGNOSIS — Z791 Long term (current) use of non-steroidal anti-inflammatories (NSAID): Secondary | ICD-10-CM | POA: Diagnosis not present

## 2021-01-03 DIAGNOSIS — K219 Gastro-esophageal reflux disease without esophagitis: Secondary | ICD-10-CM | POA: Diagnosis not present

## 2021-01-03 DIAGNOSIS — E669 Obesity, unspecified: Secondary | ICD-10-CM | POA: Diagnosis not present

## 2021-01-03 DIAGNOSIS — I10 Essential (primary) hypertension: Secondary | ICD-10-CM | POA: Diagnosis not present

## 2021-01-15 ENCOUNTER — Other Ambulatory Visit: Payer: Self-pay | Admitting: Family

## 2021-01-15 DIAGNOSIS — M810 Age-related osteoporosis without current pathological fracture: Secondary | ICD-10-CM

## 2021-04-18 ENCOUNTER — Other Ambulatory Visit: Payer: Self-pay

## 2021-04-18 ENCOUNTER — Encounter: Payer: Self-pay | Admitting: Family

## 2021-04-18 ENCOUNTER — Ambulatory Visit (INDEPENDENT_AMBULATORY_CARE_PROVIDER_SITE_OTHER): Payer: Medicare HMO | Admitting: Family

## 2021-04-18 VITALS — BP 133/75 | HR 64 | Temp 97.9°F | Ht 63.0 in | Wt 169.2 lb

## 2021-04-18 DIAGNOSIS — M1990 Unspecified osteoarthritis, unspecified site: Secondary | ICD-10-CM

## 2021-04-18 DIAGNOSIS — E782 Mixed hyperlipidemia: Secondary | ICD-10-CM | POA: Diagnosis not present

## 2021-04-18 DIAGNOSIS — E559 Vitamin D deficiency, unspecified: Secondary | ICD-10-CM | POA: Diagnosis not present

## 2021-04-18 DIAGNOSIS — K219 Gastro-esophageal reflux disease without esophagitis: Secondary | ICD-10-CM

## 2021-04-18 DIAGNOSIS — E669 Obesity, unspecified: Secondary | ICD-10-CM

## 2021-04-18 DIAGNOSIS — I10 Essential (primary) hypertension: Secondary | ICD-10-CM

## 2021-04-18 DIAGNOSIS — M810 Age-related osteoporosis without current pathological fracture: Secondary | ICD-10-CM | POA: Diagnosis not present

## 2021-04-18 LAB — CMP14+EGFR
ALT: 20 IU/L (ref 0–32)
AST: 22 IU/L (ref 0–40)
Albumin/Globulin Ratio: 1.8 (ref 1.2–2.2)
Albumin: 4.5 g/dL (ref 3.6–4.6)
Alkaline Phosphatase: 85 IU/L (ref 44–121)
BUN/Creatinine Ratio: 20 (ref 12–28)
BUN: 19 mg/dL (ref 8–27)
Bilirubin Total: 0.3 mg/dL (ref 0.0–1.2)
CO2: 24 mmol/L (ref 20–29)
Calcium: 9.8 mg/dL (ref 8.7–10.3)
Chloride: 104 mmol/L (ref 96–106)
Creatinine, Ser: 0.96 mg/dL (ref 0.57–1.00)
Globulin, Total: 2.5 g/dL (ref 1.5–4.5)
Glucose: 104 mg/dL — ABNORMAL HIGH (ref 65–99)
Potassium: 4.6 mmol/L (ref 3.5–5.2)
Sodium: 144 mmol/L (ref 134–144)
Total Protein: 7 g/dL (ref 6.0–8.5)
eGFR: 59 mL/min/{1.73_m2} — ABNORMAL LOW (ref >59)

## 2021-04-18 MED ORDER — ESOMEPRAZOLE MAGNESIUM 40 MG PO CPDR: 40.0000 mg | DELAYED_RELEASE_CAPSULE | Freq: Every day | ORAL | 1 refills | Status: DC

## 2021-04-18 MED ORDER — ALENDRONATE SODIUM 70 MG PO TABS: ORAL_TABLET | ORAL | 0 refills | Status: DC

## 2021-04-18 MED ORDER — AMLODIPINE BESYLATE 5 MG PO TABS
5.0000 mg | ORAL_TABLET | Freq: Every day | ORAL | 1 refills | Status: DC
Start: 2021-04-18 — End: 2021-10-15

## 2021-04-18 MED ORDER — MELOXICAM 7.5 MG PO TABS
7.5000 mg | ORAL_TABLET | Freq: Every day | ORAL | 1 refills | Status: DC
Start: 2021-04-18 — End: 2021-10-15

## 2021-04-18 MED ORDER — CETIRIZINE HCL 10 MG PO TABS: 10.0000 mg | ORAL_TABLET | Freq: Every day | ORAL | 2 refills | Status: DC

## 2021-04-18 NOTE — Patient Instructions (Signed)

## 2021-04-18 NOTE — Progress Notes (Signed)
 Subjective:    Patient ID: Frances Morrison, female    DOB: 03/09/1941, 80 y.o.   MRN: [id number]  Chief Complaint  Patient presents with   Medical Management of Chronic Issues    Pt calls the office today for chronic follow up. Hypertension This is a chronic problem. The current episode started more than 1 year ago. The problem has been resolved since onset. The problem is controlled. Pertinent negatives include no malaise/fatigue, peripheral edema or shortness of breath. Risk factors for coronary artery disease include dyslipidemia and sedentary lifestyle. The current treatment provides moderate improvement.  Gastroesophageal Reflux She complains of belching and heartburn. This is a chronic problem. The current episode started more than 1 year ago. The problem occurs occasionally. The problem has been waxing and waning. She has tried a PPI for the symptoms. The treatment provided moderate relief.  Arthritis Presents for follow-up visit. She complains of pain and stiffness. The symptoms have been stable. Affected locations include the right foot, left foot, right knee, left knee, left MCP and right MCP. Her pain is at a severity of 1/10.  Hyperlipidemia This is a chronic problem. The current episode started more than 1 year ago. Exacerbating diseases include obesity. Pertinent negatives include no shortness of breath. Current antihyperlipidemic treatment includes statins. The current treatment provides moderate improvement of lipids. Risk factors for coronary artery disease include dyslipidemia, hypertension, female sex and a sedentary lifestyle.     Review of Systems  Constitutional:  Negative for malaise/fatigue.  Respiratory:  Negative for shortness of breath.   Gastrointestinal:  Positive for heartburn.  Musculoskeletal:  Positive for arthritis and stiffness.  All other systems reviewed and are negative.     Objective:   Physical Exam Vitals reviewed.  Constitutional:      General:  She is not in acute distress.    Appearance: She is well-developed.  HENT:     Head: Normocephalic and atraumatic.     Right Ear: Tympanic membrane normal.     Left Ear: Tympanic membrane normal.  Eyes:     Pupils: Pupils are equal, round, and reactive to light.  Neck:     Thyroid: No thyromegaly.  Cardiovascular:     Rate and Rhythm: Normal rate and regular rhythm.     Heart sounds: Normal heart sounds. No murmur heard. Pulmonary:     Effort: Pulmonary effort is normal. No respiratory distress.     Breath sounds: Normal breath sounds. No wheezing.  Abdominal:     General: Bowel sounds are normal. There is no distension.     Palpations: Abdomen is soft.     Tenderness: There is no abdominal tenderness.  Musculoskeletal:        General: No tenderness. Normal range of motion.     Cervical back: Normal range of motion and neck supple.  Skin:    General: Skin is warm and dry.  Neurological:     Mental Status: She is alert and oriented to person, place, and time.     Cranial Nerves: No cranial nerve deficit.     Deep Tendon Reflexes: Reflexes are normal and symmetric.  Psychiatric:        Behavior: Behavior normal.        Thought Content: Thought content normal.        Judgment: Judgment normal.         BP 133/75 Comment: patient reports at home  Pulse 64   Temp 97.9 F (36.6 C) (Temporal)     Ht 5' 3" (1.6 m)   Wt 169 lb 3.2 oz (76.7 kg)   SpO2 97%   BMI 29.97 kg/m   Assessment & Plan:  Thaily W Petrilla comes in today with chief complaint of Medical Management of Chronic Issues   Diagnosis and orders addressed:  1. Arthritis - meloxicam (MOBIC) 7.5 MG tablet; Take 1 tablet (7.5 mg total) by mouth daily. (Needs to be seen before next refill)  Dispense: 90 tablet; Refill: 1 - CMP14+EGFR  2. Gastroesophageal reflux disease, unspecified whether esophagitis present - esomeprazole (NEXIUM) 40 MG capsule; Take 1 capsule (40 mg total) by mouth daily before breakfast. (Needs  to be seen before next refill)  Dispense: 90 capsule; Refill: 1 - CMP14+EGFR  3. Osteoporosis, unspecified osteoporosis type, unspecified pathological fracture presence - alendronate (FOSAMAX) 70 MG tablet; Take with a full glass of water on an empty stomach.  Dispense: 12 tablet; Refill: 0 - CMP14+EGFR  4. Primary hypertensio - amLODipine (NORVASC) 5 MG tablet; Take 1 tablet (5 mg total) by mouth daily. (Needs to be seen before next refill)  Dispense: 90 tablet; Refill: 1 - CMP14+EGFR  5. Mixed hyperlipidemia - CMP14+EGFR  6. Obesity (BMI 30-39.9) - CMP14+EGFR  7. Vitamin D deficiency - CMP14+EGFR   Labs pending Health Maintenance reviewed Diet and exercise encouraged  Follow up plan: 6 months   Christy Hawks, FNP    

## 2021-06-19 DIAGNOSIS — Z85828 Personal history of other malignant neoplasm of skin: Secondary | ICD-10-CM | POA: Diagnosis not present

## 2021-06-19 DIAGNOSIS — L570 Actinic keratosis: Secondary | ICD-10-CM | POA: Diagnosis not present

## 2021-06-19 DIAGNOSIS — L821 Other seborrheic keratosis: Secondary | ICD-10-CM | POA: Diagnosis not present

## 2021-07-16 ENCOUNTER — Other Ambulatory Visit: Payer: Self-pay | Admitting: Family

## 2021-07-16 DIAGNOSIS — M810 Age-related osteoporosis without current pathological fracture: Secondary | ICD-10-CM

## 2021-10-15 ENCOUNTER — Other Ambulatory Visit: Payer: Self-pay | Admitting: Family

## 2021-10-15 DIAGNOSIS — M1990 Unspecified osteoarthritis, unspecified site: Secondary | ICD-10-CM

## 2021-10-15 DIAGNOSIS — M810 Age-related osteoporosis without current pathological fracture: Secondary | ICD-10-CM

## 2021-10-15 DIAGNOSIS — I10 Essential (primary) hypertension: Secondary | ICD-10-CM

## 2021-10-15 DIAGNOSIS — K219 Gastro-esophageal reflux disease without esophagitis: Secondary | ICD-10-CM

## 2021-10-21 ENCOUNTER — Ambulatory Visit: Payer: Medicare HMO | Admitting: Family

## 2021-10-21 DIAGNOSIS — Z1231 Encounter for screening mammogram for malignant neoplasm of breast: Secondary | ICD-10-CM | POA: Diagnosis not present

## 2021-10-25 ENCOUNTER — Ambulatory Visit (INDEPENDENT_AMBULATORY_CARE_PROVIDER_SITE_OTHER): Payer: Medicare HMO | Admitting: Family

## 2021-10-25 ENCOUNTER — Encounter: Payer: Self-pay | Admitting: Family

## 2021-10-25 VITALS — BP 137/73 | HR 70 | Temp 97.6°F | Ht 63.0 in | Wt 165.0 lb

## 2021-10-25 DIAGNOSIS — I10 Essential (primary) hypertension: Secondary | ICD-10-CM

## 2021-10-25 DIAGNOSIS — Z0001 Encounter for general adult medical examination with abnormal findings: Secondary | ICD-10-CM | POA: Diagnosis not present

## 2021-10-25 DIAGNOSIS — Z0000 Encounter for general adult medical examination without abnormal findings: Secondary | ICD-10-CM

## 2021-10-25 DIAGNOSIS — E782 Mixed hyperlipidemia: Secondary | ICD-10-CM

## 2021-10-25 DIAGNOSIS — Z23 Encounter for immunization: Secondary | ICD-10-CM

## 2021-10-25 DIAGNOSIS — M810 Age-related osteoporosis without current pathological fracture: Secondary | ICD-10-CM | POA: Diagnosis not present

## 2021-10-25 DIAGNOSIS — E559 Vitamin D deficiency, unspecified: Secondary | ICD-10-CM

## 2021-10-25 DIAGNOSIS — M1990 Unspecified osteoarthritis, unspecified site: Secondary | ICD-10-CM

## 2021-10-25 DIAGNOSIS — K219 Gastro-esophageal reflux disease without esophagitis: Secondary | ICD-10-CM

## 2021-10-25 DIAGNOSIS — E669 Obesity, unspecified: Secondary | ICD-10-CM

## 2021-10-25 NOTE — Progress Notes (Signed)
Subjective:    Patient ID: Frances Morrison, female    DOB: June 18, 1941, 81 y.o.   MRN: [id number]  Chief Complaint  Patient presents with   Medical Management of Chronic Issues   Pt calls the office today for CPE and chronic follow up. Her BP is elevated today, but states her home BP this morning before leaving was 137/73.   She has osteoporosis and takes Fosamax weekly. States she does not do any weight bearing exercises, except cleaning her home. She takes calcium and vit d daily. Last Dexascan was 10/18/20.  Hypertension This is a chronic problem. The current episode started more than 1 year ago. The problem has been resolved since onset. The problem is controlled. Pertinent negatives include no malaise/fatigue, peripheral edema or shortness of breath. Risk factors for coronary artery disease include dyslipidemia and obesity. The current treatment provides moderate improvement.  Gastroesophageal Reflux She complains of belching and heartburn. This is a chronic problem. The current episode started more than 1 year ago. The problem occurs rarely. The problem has been waxing and waning. She has tried a PPI for the symptoms. The treatment provided moderate relief.  Arthritis Presents for follow-up visit. She complains of pain and stiffness. She reports no joint warmth. The symptoms have been stable. Affected locations include the left knee, right knee, left foot and right foot. Her pain is at a severity of 7/10.  Hyperlipidemia This is a chronic problem. The current episode started more than 1 year ago. The problem is controlled. Exacerbating diseases include obesity. Pertinent negatives include no shortness of breath. Current antihyperlipidemic treatment includes diet change. The current treatment provides mild improvement of lipids. Risk factors for coronary artery disease include dyslipidemia, hypertension, post-menopausal and a sedentary lifestyle.     Review of Systems  Constitutional:   Negative for malaise/fatigue.  Respiratory:  Negative for shortness of breath.   Gastrointestinal:  Positive for heartburn.  Musculoskeletal:  Positive for arthritis and stiffness.  All other systems reviewed and are negative.  Family History  Problem Relation Age of Onset   Cancer Mother    Cancer Father    Social History   Socioeconomic History   Marital status: Married    Spouse name: Not on file   Number of children: Not on file   Years of education: Not on file   Highest education level: Not on file  Occupational History   Not on file  Tobacco Use   Smoking status: Former    Packs/day: 1.00    Years: 12.00    Pack years: 12.00    Types: Cigarettes    Quit date: 07/10/2010    Years since quitting: 11.3   Smokeless tobacco: Never  Vaping Use   Vaping Use: Never used  Substance and Sexual Activity   Alcohol use: No   Drug use: No   Sexual activity: Not Currently  Other Topics Concern   Not on file  Social History Narrative   Not on file   Social Determinants of Health   Financial Resource Strain: Not on file  Food Insecurity: Not on file  Transportation Needs: Not on file  Physical Activity: Not on file  Stress: Not on file  Social Connections: Not on file       Objective:   Physical Exam Vitals reviewed.  Constitutional:      General: She is not in acute distress.    Appearance: She is well-developed.  HENT:     Head: Normocephalic and atraumatic.  Right Ear: Tympanic membrane normal.     Left Ear: Tympanic membrane normal.  Eyes:     Pupils: Pupils are equal, round, and reactive to light.  Neck:     Thyroid: No thyromegaly.  Cardiovascular:     Rate and Rhythm: Normal rate and regular rhythm.     Heart sounds: Normal heart sounds. No murmur heard. Pulmonary:     Effort: Pulmonary effort is normal. No respiratory distress.     Breath sounds: Normal breath sounds. No wheezing.  Abdominal:     General: Bowel sounds are normal. There is no  distension.     Palpations: Abdomen is soft.     Tenderness: There is no abdominal tenderness.  Musculoskeletal:        General: No tenderness. Normal range of motion.     Cervical back: Normal range of motion and neck supple.  Skin:    General: Skin is warm and dry.  Neurological:     Mental Status: She is alert and oriented to person, place, and time.     Cranial Nerves: No cranial nerve deficit.     Deep Tendon Reflexes: Reflexes are normal and symmetric.  Psychiatric:        Behavior: Behavior normal.        Thought Content: Thought content normal.        Judgment: Judgment normal.      BP (!) 155/70    Pulse 70    Temp 97.6 F (36.4 C) (Temporal)    Ht _0  (1.6 m)    Wt 165 lb (74.8 kg)    BMI 29.23 kg/m      Assessment & Plan:  Frances Morrison comes in today with chief complaint of Medical Management of Chronic Issues   Diagnosis and orders addressed:  1. Primary hypertension - CMP14+EGFR - CBC with Differential/Platelet  2. Gastroesophageal reflux disease, unspecified whether esophagitis present - CMP14+EGFR - CBC with Differential/Platelet  3. Osteoporosis, unspecified osteoporosis type, unspecified pathological fracture presence - CMP14+EGFR - CBC with Differential/Platelet - VITAMIN D 25 Hydroxy (Vit-D Deficiency, Fractures)  4. Arthritis - CMP14+EGFR - CBC with Differential/Platelet  5. Mixed hyperlipidemia - CMP14+EGFR - CBC with Differential/Platelet  6. Obesity (BMI 30-39.9) - CMP14+EGFR - CBC with Differential/Platelet  7. Vitamin D deficiency - CMP14+EGFR - CBC with Differential/Platelet - VITAMIN D 25 Hydroxy (Vit-D Deficiency, Fractures)  8. Annual physical exam - CMP14+EGFR - CBC with Differential/Platelet - Lipid panel - TSH - VITAMIN D 25 Hydroxy (Vit-D Deficiency, Fractures)   Labs pending Health Maintenance reviewed Diet and exercise encouraged  Follow up plan: 6 months    [name], FNP

## 2021-10-25 NOTE — Patient Instructions (Signed)
Osteoporosis Osteoporosis happens when the bones become thin and less dense than normal. Osteoporosis makes bones more brittle and fragile and more likely to break (fracture). Over time, osteoporosis can cause your bones to become so weak that they fracture after a minor fall. Bones in the hip, wrist, and spine are most likely to fracture due to osteoporosis. What are the causes? The exact cause of this condition is not known. What increases the risk? You are more likely to develop this condition if you: Have family members with this condition. Have poor nutrition. Use the following: Steroid medicines, such as prednisone. Anti-seizure medicines. Nicotine or tobacco, such as cigarettes, e-cigarettes, and chewing tobacco. Are female. Are age 28 or older. Are not physically active (are sedentary). Are of European or Asian descent. Have a small body frame. What are the signs or symptoms? A fracture might be the first sign of osteoporosis, especially if the fracture results from a fall or injury that usually would not cause a bone to break. Other signs and symptoms include: Pain in the neck or low back. Stooped posture. Loss of height. How is this diagnosed? This condition may be diagnosed based on: Your medical history. A physical exam. A bone mineral density test, also called a DXA or DEXA test (dual-energy X-ray absorptiometry test). This test uses X-rays to measure the amount of minerals in your bones. How is this treated? This condition may be treated by: Making lifestyle changes, such as: Including foods with more calcium and vitamin D in your diet. Doing weight-bearing and muscle-strengthening exercises. Stopping tobacco use. Limiting alcohol intake. Taking medicine to slow the process of bone loss or to increase bone density. Taking daily supplements of calcium and vitamin D. Taking hormone replacement medicines, such as estrogen for women and testosterone for men. Monitoring  your levels of calcium and vitamin D. The goal of treatment is to strengthen your bones and lower your risk for a fracture. Follow these instructions at home: Eating and drinking Include calcium and vitamin D in your diet. Calcium is important for bone health, and vitamin D helps your body absorb calcium. Good sources of calcium and vitamin D include: Certain fatty fish, such as salmon and tuna. Products that have calcium and vitamin D added to them (are fortified), such as fortified cereals. Egg yolks. Cheese. Liver.  Activity Do exercises as told by your health care provider. Ask your health care provider what exercises and activities are safe for you. You should do: Exercises that make you work against gravity (weight-bearing exercises), such as tai chi, yoga, or walking. Exercises to strengthen muscles, such as lifting weights. Lifestyle Do not drink alcohol if: Your health care provider tells you not to drink. You are pregnant, may be pregnant, or are planning to become pregnant. If you drink alcohol: Limit how much you use to: 0-1 drink a day for women. 0-2 drinks a day for men. Know how much alcohol is in your drink. In the [place] one drink equals one 12 oz bottle of beer (355 mL), one 5 oz glass of wine (148 mL), or one 1 oz glass of hard liquor (44 mL). Do not use any products that contain nicotine or tobacco, such as cigarettes, e-cigarettes, and chewing tobacco. If you need help quitting, ask your health care provider. Preventing falls Use devices to help you move around (mobility aids) as needed, such as canes, walkers, scooters, or crutches. Keep rooms well-lit and clutter-free. Remove tripping hazards from walkways, including cords and throw  rugs. °Install grab bars in bathrooms and safety rails on stairs. °Use rubber mats in the bathroom and other areas that are often wet or slippery. °Wear closed-toe shoes that fit well and support your feet. Wear shoes that have rubber  soles or low heels. °Review your medicines with your health care provider. Some medicines can cause dizziness or changes in blood pressure, which can increase your risk of falling. °General instructions °Take over-the-counter and prescription medicines only as told by your health care provider. °Keep all follow-up visits. This is important. °Contact a health care provider if: °You have never been screened for osteoporosis and you are: °A woman who is age 65 or older. °A man who is age 70 or older. °Get help right away if: °You fall or injure yourself. °Summary °Osteoporosis is thinning and loss of density in your bones. This makes bones more brittle and fragile and more likely to break (fracture),even with minor falls. °The goal of treatment is to strengthen your bones and lower your risk for a fracture. °Include calcium and vitamin D in your diet. Calcium is important for bone health, and vitamin D helps your body absorb calcium. °Talk with your health care provider about screening for osteoporosis if you are a woman who is age 65 or older, or a man who is age 70 or older. °This information is not intended to replace advice given to you by your health care provider. Make sure you discuss any questions you have with your health care provider. °Document Revised: 03/08/2020 Document Reviewed: 03/08/2020 °Elsevier Patient Education © 2022 [employer] Inc. ° °

## 2021-10-26 LAB — CBC WITH DIFFERENTIAL/PLATELET
Basophils Absolute: 0 10*3/uL (ref 0.0–0.2)
Basos: 1 %
EOS (ABSOLUTE): 0.2 10*3/uL (ref 0.0–0.4)
Eos: 3 %
Hematocrit: 36.1 % (ref 34.0–46.6)
Hemoglobin: 12.4 g/dL (ref 11.1–15.9)
Immature Grans (Abs): 0 10*3/uL (ref 0.0–0.1)
Immature Granulocytes: 0 %
Lymphocytes Absolute: 1.8 10*3/uL (ref 0.7–3.1)
Lymphs: 22 %
MCH: 29.7 pg (ref 26.6–33.0)
MCHC: 34.3 g/dL (ref 31.5–35.7)
MCV: 86 fL (ref 79–97)
Monocytes Absolute: 0.6 10*3/uL (ref 0.1–0.9)
Monocytes: 8 %
Neutrophils Absolute: 5.4 10*3/uL (ref 1.4–7.0)
Neutrophils: 66 %
Platelets: 213 10*3/uL (ref 150–450)
RBC: 4.18 x10E6/uL (ref 3.77–5.28)
RDW: 13.3 % (ref 11.7–15.4)
WBC: 8.1 10*3/uL (ref 3.4–10.8)

## 2021-10-26 LAB — LIPID PANEL
Chol/HDL Ratio: 3.2 ratio (ref 0.0–4.4)
Cholesterol, Total: 171 mg/dL (ref 100–199)
HDL: 53 mg/dL (ref >39)
LDL Chol Calc (NIH): 89 mg/dL (ref 0–99)
Triglycerides: 169 mg/dL — ABNORMAL HIGH (ref 0–149)
VLDL Cholesterol Cal: 29 mg/dL (ref 5–40)

## 2021-10-26 LAB — CMP14+EGFR
ALT: 17 IU/L (ref 0–32)
AST: 24 IU/L (ref 0–40)
Albumin/Globulin Ratio: 1.6 (ref 1.2–2.2)
Albumin: 4.2 g/dL (ref 3.6–4.6)
Alkaline Phosphatase: 84 IU/L (ref 44–121)
BUN/Creatinine Ratio: 19 (ref 12–28)
BUN: 21 mg/dL (ref 8–27)
Bilirubin Total: 0.2 mg/dL (ref 0.0–1.2)
CO2: 25 mmol/L (ref 20–29)
Calcium: 9.6 mg/dL (ref 8.7–10.3)
Chloride: 103 mmol/L (ref 96–106)
Creatinine, Ser: 1.12 mg/dL — ABNORMAL HIGH (ref 0.57–1.00)
Globulin, Total: 2.6 g/dL (ref 1.5–4.5)
Glucose: 107 mg/dL — ABNORMAL HIGH (ref 70–99)
Potassium: 4.8 mmol/L (ref 3.5–5.2)
Sodium: 142 mmol/L (ref 134–144)
Total Protein: 6.8 g/dL (ref 6.0–8.5)
eGFR: 49 mL/min/{1.73_m2} — ABNORMAL LOW (ref >59)

## 2021-10-26 LAB — VITAMIN D 25 HYDROXY (VIT D DEFICIENCY, FRACTURES): Vit D, 25-Hydroxy: 36.9 ng/mL (ref 30.0–100.0)

## 2021-10-26 LAB — TSH: TSH: 1.12 u[IU]/mL (ref 0.450–4.500)

## 2021-11-27 DIAGNOSIS — L570 Actinic keratosis: Secondary | ICD-10-CM | POA: Diagnosis not present

## 2021-11-27 DIAGNOSIS — Z1283 Encounter for screening for malignant neoplasm of skin: Secondary | ICD-10-CM | POA: Diagnosis not present

## 2021-11-27 DIAGNOSIS — Z85828 Personal history of other malignant neoplasm of skin: Secondary | ICD-10-CM | POA: Diagnosis not present

## 2022-01-03 ENCOUNTER — Other Ambulatory Visit: Payer: Self-pay | Admitting: Family

## 2022-01-03 DIAGNOSIS — I10 Essential (primary) hypertension: Secondary | ICD-10-CM

## 2022-01-03 DIAGNOSIS — M810 Age-related osteoporosis without current pathological fracture: Secondary | ICD-10-CM

## 2022-01-03 DIAGNOSIS — K219 Gastro-esophageal reflux disease without esophagitis: Secondary | ICD-10-CM

## 2022-01-03 DIAGNOSIS — M1990 Unspecified osteoarthritis, unspecified site: Secondary | ICD-10-CM

## 2022-04-07 ENCOUNTER — Other Ambulatory Visit: Payer: Self-pay | Admitting: Family

## 2022-04-07 DIAGNOSIS — M1990 Unspecified osteoarthritis, unspecified site: Secondary | ICD-10-CM

## 2022-04-07 DIAGNOSIS — K219 Gastro-esophageal reflux disease without esophagitis: Secondary | ICD-10-CM

## 2022-04-07 DIAGNOSIS — M810 Age-related osteoporosis without current pathological fracture: Secondary | ICD-10-CM

## 2022-04-07 DIAGNOSIS — I10 Essential (primary) hypertension: Secondary | ICD-10-CM

## 2022-04-24 ENCOUNTER — Encounter: Payer: Self-pay | Admitting: Family

## 2022-04-24 ENCOUNTER — Ambulatory Visit (INDEPENDENT_AMBULATORY_CARE_PROVIDER_SITE_OTHER): Payer: Medicare HMO | Admitting: Family

## 2022-04-24 VITALS — BP 123/74 | HR 64 | Temp 97.6°F | Ht 63.0 in | Wt 162.2 lb

## 2022-04-24 DIAGNOSIS — Z23 Encounter for immunization: Secondary | ICD-10-CM | POA: Diagnosis not present

## 2022-04-24 DIAGNOSIS — R61 Generalized hyperhidrosis: Secondary | ICD-10-CM

## 2022-04-24 DIAGNOSIS — E782 Mixed hyperlipidemia: Secondary | ICD-10-CM | POA: Diagnosis not present

## 2022-04-24 DIAGNOSIS — I10 Essential (primary) hypertension: Secondary | ICD-10-CM | POA: Diagnosis not present

## 2022-04-24 DIAGNOSIS — K219 Gastro-esophageal reflux disease without esophagitis: Secondary | ICD-10-CM | POA: Diagnosis not present

## 2022-04-24 DIAGNOSIS — E559 Vitamin D deficiency, unspecified: Secondary | ICD-10-CM | POA: Diagnosis not present

## 2022-04-24 DIAGNOSIS — M1990 Unspecified osteoarthritis, unspecified site: Secondary | ICD-10-CM

## 2022-04-24 DIAGNOSIS — M810 Age-related osteoporosis without current pathological fracture: Secondary | ICD-10-CM

## 2022-04-24 NOTE — Patient Instructions (Signed)
Health Maintenance After Age 82 After age 82, you are at a higher risk for certain long-term diseases and infections as well as injuries from falls. Falls are a major cause of broken bones and head injuries in people who are older than age 82. Getting regular preventive care can help to keep you healthy and well. Preventive care includes getting regular testing and making lifestyle changes as recommended by your health care provider. Talk with your health care provider about: Which screenings and tests you should have. A screening is a test that checks for a disease when you have no symptoms. A diet and exercise plan that is right for you. What should I know about screenings and tests to prevent falls? Screening and testing are the best ways to find a health problem early. Early diagnosis and treatment give you the best chance of managing medical conditions that are common after age 82. Certain conditions and lifestyle choices may make you more likely to have a fall. Your health care provider may recommend: Regular vision checks. Poor vision and conditions such as cataracts can make you more likely to have a fall. If you wear glasses, make sure to get your prescription updated if your vision changes. Medicine review. Work with your health care provider to regularly review all of the medicines you are taking, including over-the-counter medicines. Ask your health care provider about any side effects that may make you more likely to have a fall. Tell your health care provider if any medicines that you take make you feel dizzy or sleepy. Strength and balance checks. Your health care provider may recommend certain tests to check your strength and balance while standing, walking, or changing positions. Foot health exam. Foot pain and numbness, as well as not wearing proper footwear, can make you more likely to have a fall. Screenings, including: Osteoporosis screening. Osteoporosis is a condition that causes  the bones to get weaker and break more easily. Blood pressure screening. Blood pressure changes and medicines to control blood pressure can make you feel dizzy. Depression screening. You may be more likely to have a fall if you have a fear of falling, feel depressed, or feel unable to do activities that you used to do. Alcohol use screening. Using too much alcohol can affect your balance and may make you more likely to have a fall. Follow these instructions at home: Lifestyle Do not drink alcohol if: Your health care provider tells you not to drink. If you drink alcohol: Limit how much you have to: 0-1 drink a day for women. 0-2 drinks a day for men. Know how much alcohol is in your drink. In the [place] one drink equals one 12 oz bottle of beer (355 mL), one 5 oz glass of wine (148 mL), or one 1 oz glass of hard liquor (44 mL). Do not use any products that contain nicotine or tobacco. These products include cigarettes, chewing tobacco, and vaping devices, such as e-cigarettes. If you need help quitting, ask your health care provider. Activity  Follow a regular exercise program to stay fit. This will help you maintain your balance. Ask your health care provider what types of exercise are appropriate for you. If you need a cane or walker, use it as recommended by your health care provider. Wear supportive shoes that have nonskid soles. Safety  Remove any tripping hazards, such as rugs, cords, and clutter. Install safety equipment such as grab bars in bathrooms and safety rails on stairs. Keep rooms and walkways   well-lit. General instructions Talk with your health care provider about your risks for falling. Tell your health care provider if: You fall. Be sure to tell your health care provider about all falls, even ones that seem minor. You feel dizzy, tiredness (fatigue), or off-balance. Take over-the-counter and prescription medicines only as told by your health care provider. These include  supplements. Eat a healthy diet and maintain a healthy weight. A healthy diet includes low-fat dairy products, low-fat (lean) meats, and fiber from whole grains, beans, and lots of fruits and vegetables. Stay current with your vaccines. Schedule regular health, dental, and eye exams. Summary Having a healthy lifestyle and getting preventive care can help to protect your health and wellness after age 82. Screening and testing are the best way to find a health problem early and help you avoid having a fall. Early diagnosis and treatment give you the best chance for managing medical conditions that are more common for people who are older than age 82. Falls are a major cause of broken bones and head injuries in people who are older than age 82. Take precautions to prevent a fall at home. Work with your health care provider to learn what changes you can make to improve your health and wellness and to prevent falls. This information is not intended to replace advice given to you by your health care provider. Make sure you discuss any questions you have with your health care provider. Document Revised: 02/11/2021 Document Reviewed: 02/11/2021 Elsevier Patient Education  2023 Elsevier Inc.  

## 2022-04-24 NOTE — Progress Notes (Signed)
Subjective:    Patient ID: Frances Morrison, female    DOB: 1941-02-27, 81 y.o.   MRN: [id number]  Chief Complaint  Patient presents with   Medical Management of Chronic Issues   Pt presents to the office today for chronic follow up. Her BP is elevated today, but states her home BP this morning before leaving was 123/74.   She has osteoporosis and takes Fosamax weekly. States she does not do any weight bearing exercises, except cleaning her home. She takes calcium and vit d daily. Last Dexascan was 10/18/20.   She is complaining of night sweats the last few months. States she will wake up sweating and will get a ice pack. States it only lasts a few mins.  Hypertension This is a chronic problem. The current episode started more than 1 year ago. The problem has been resolved since onset. The problem is controlled. Pertinent negatives include no malaise/fatigue, peripheral edema or shortness of breath. Risk factors for coronary artery disease include dyslipidemia, obesity and sedentary lifestyle. The current treatment provides moderate improvement.  Gastroesophageal Reflux She complains of belching and heartburn. This is a chronic problem. The current episode started more than 1 year ago. The problem occurs occasionally. She has tried a PPI for the symptoms. The treatment provided moderate relief.  Arthritis Presents for follow-up visit. She complains of pain and stiffness. The symptoms have been stable. Affected locations include the right ankle and left ankle. Her pain is at a severity of 1/10.  Hyperlipidemia This is a chronic problem. The current episode started more than 1 year ago. The problem is uncontrolled. Pertinent negatives include no shortness of breath. Current antihyperlipidemic treatment includes diet change. The current treatment provides mild improvement of lipids. Risk factors for coronary artery disease include dyslipidemia, hypertension, a sedentary lifestyle and post-menopausal.       Review of Systems  Constitutional:  Negative for malaise/fatigue.  Respiratory:  Negative for shortness of breath.   Gastrointestinal:  Positive for heartburn.  Musculoskeletal:  Positive for arthritis and stiffness.  All other systems reviewed and are negative.      Objective:   Physical Exam Vitals reviewed.  Constitutional:      General: She is not in acute distress.    Appearance: She is well-developed. She is obese.  HENT:     Head: Normocephalic and atraumatic.     Right Ear: Tympanic membrane normal.     Left Ear: Tympanic membrane normal.  Eyes:     Pupils: Pupils are equal, round, and reactive to light.  Neck:     Thyroid: No thyromegaly.  Cardiovascular:     Rate and Rhythm: Normal rate and regular rhythm.     Heart sounds: Normal heart sounds. No murmur heard. Pulmonary:     Effort: Pulmonary effort is normal. No respiratory distress.     Breath sounds: Normal breath sounds. No wheezing.  Abdominal:     General: Bowel sounds are normal. There is no distension.     Palpations: Abdomen is soft.     Tenderness: There is no abdominal tenderness.  Musculoskeletal:        General: No tenderness. Normal range of motion.     Cervical back: Normal range of motion and neck supple.  Skin:    General: Skin is warm and dry.  Neurological:     Mental Status: She is alert and oriented to person, place, and time.     Cranial Nerves: No cranial nerve deficit.  Deep Tendon Reflexes: Reflexes are normal and symmetric.  Psychiatric:        Behavior: Behavior normal.        Thought Content: Thought content normal.        Judgment: Judgment normal.      BP 123/74 Comment: States at home this AM  Pulse 64   Temp 97.6 F (36.4 C)   Ht _0  (1.6 m)   Wt 162 lb 3.2 oz (73.6 kg)   SpO2 96%   BMI 28.73 kg/m      Assessment & Plan:  KONI KANNAN comes in today with chief complaint of Medical Management of Chronic Issues   Diagnosis and orders  addressed:  1. Primary hypertension - CMP14+EGFR - CBC with Differential/Platelet  2. Gastroesophageal reflux disease, unspecified whether esophagitis present - CMP14+EGFR - CBC with Differential/Platelet  3. Osteoporosis, unspecified osteoporosis type, unspecified pathological fracture presence - CMP14+EGFR - CBC with Differential/Platelet  4. Arthritis  - CMP14+EGFR - CBC with Differential/Platelet  5. Vitamin D deficiency - CMP14+EGFR - CBC with Differential/Platelet  6. Mixed hyperlipidemia - CMP14+EGFR - CBC with Differential/Platelet  7. Night sweat - CMP14+EGFR - CBC with Differential/Platelet - TSH   Labs pending Health Maintenance reviewed Diet and exercise encouraged  Follow up plan: 6 months    [name], FNP

## 2022-04-24 NOTE — Addendum Note (Signed)
Addended by: Christia Reading on: 04/24/2022 03:47 PM   Modules accepted: Orders

## 2022-04-25 LAB — CBC WITH DIFFERENTIAL/PLATELET
Basophils Absolute: 0.1 10*3/uL (ref 0.0–0.2)
Basos: 1 %
EOS (ABSOLUTE): 0.2 10*3/uL (ref 0.0–0.4)
Eos: 2 %
Hematocrit: 39 % (ref 34.0–46.6)
Hemoglobin: 12.8 g/dL (ref 11.1–15.9)
Immature Grans (Abs): 0 10*3/uL (ref 0.0–0.1)
Immature Granulocytes: 0 %
Lymphocytes Absolute: 1.6 10*3/uL (ref 0.7–3.1)
Lymphs: 17 %
MCH: 29 pg (ref 26.6–33.0)
MCHC: 32.8 g/dL (ref 31.5–35.7)
MCV: 88 fL (ref 79–97)
Monocytes Absolute: 0.6 10*3/uL (ref 0.1–0.9)
Monocytes: 7 %
Neutrophils Absolute: 6.6 10*3/uL (ref 1.4–7.0)
Neutrophils: 73 %
Platelets: 205 10*3/uL (ref 150–450)
RBC: 4.41 x10E6/uL (ref 3.77–5.28)
RDW: 13.2 % (ref 11.7–15.4)
WBC: 9.1 10*3/uL (ref 3.4–10.8)

## 2022-04-25 LAB — CMP14+EGFR
ALT: 16 IU/L (ref 0–32)
AST: 24 IU/L (ref 0–40)
Albumin/Globulin Ratio: 1.6 (ref 1.2–2.2)
Albumin: 4.5 g/dL (ref 3.7–4.7)
Alkaline Phosphatase: 85 IU/L (ref 44–121)
BUN/Creatinine Ratio: 15 (ref 12–28)
BUN: 13 mg/dL (ref 8–27)
Bilirubin Total: 0.2 mg/dL (ref 0.0–1.2)
CO2: 23 mmol/L (ref 20–29)
Calcium: 9.6 mg/dL (ref 8.7–10.3)
Chloride: 103 mmol/L (ref 96–106)
Creatinine, Ser: 0.89 mg/dL (ref 0.57–1.00)
Globulin, Total: 2.8 g/dL (ref 1.5–4.5)
Glucose: 99 mg/dL (ref 70–99)
Potassium: 5 mmol/L (ref 3.5–5.2)
Sodium: 142 mmol/L (ref 134–144)
Total Protein: 7.3 g/dL (ref 6.0–8.5)
eGFR: 65 mL/min/{1.73_m2} (ref >59)

## 2022-04-25 LAB — TSH: TSH: 1.36 u[IU]/mL (ref 0.450–4.500)

## 2022-07-09 ENCOUNTER — Other Ambulatory Visit: Payer: Self-pay | Admitting: Family

## 2022-07-09 DIAGNOSIS — M1990 Unspecified osteoarthritis, unspecified site: Secondary | ICD-10-CM

## 2022-07-09 DIAGNOSIS — M810 Age-related osteoporosis without current pathological fracture: Secondary | ICD-10-CM

## 2022-07-09 DIAGNOSIS — K219 Gastro-esophageal reflux disease without esophagitis: Secondary | ICD-10-CM

## 2022-07-09 DIAGNOSIS — I10 Essential (primary) hypertension: Secondary | ICD-10-CM

## 2022-09-05 ENCOUNTER — Ambulatory Visit (INDEPENDENT_AMBULATORY_CARE_PROVIDER_SITE_OTHER): Payer: Medicare HMO | Admitting: Nurse Practitioner

## 2022-09-05 ENCOUNTER — Encounter: Payer: Self-pay | Admitting: Nurse Practitioner

## 2022-09-05 DIAGNOSIS — U071 COVID-19: Secondary | ICD-10-CM

## 2022-09-05 MED ORDER — MOLNUPIRAVIR EUA 200MG CAPSULE: 4.0000 | ORAL_CAPSULE | Freq: Two times a day (BID) | ORAL | 0 refills | Status: AC

## 2022-09-05 NOTE — Progress Notes (Signed)
   Virtual Visit  Note Due to COVID-19 pandemic this visit was conducted virtually. This visit type was conducted due to national recommendations for restrictions regarding the COVID-19 Pandemic (e.g. social distancing, sheltering in place) in an effort to limit this patient's exposure and mitigate transmission in our community. All issues noted in this document were discussed and addressed.  A physical exam was not performed with this format.  I connected with Frances Morrison on 09/05/22 at 10:30 am  by telephone and verified that I am speaking with the correct person using two identifiers. Frances Morrison is currently located at home during visit. The provider, Ivy Lynn, NP is located in their office at time of visit.  I discussed the limitations, risks, security and privacy concerns of performing an evaluation and management service by telephone and the availability of in person appointments. I also discussed with the patient that there may be a patient responsible charge related to this service. The patient expressed understanding and agreed to proceed.   History and Present Illness:  URI  This is a new problem. The current episode started yesterday. The problem has been unchanged. There has been no fever. Associated symptoms include headaches. Pertinent negatives include no coughing, rash or sinus pain. She has tried nothing for the symptoms.      Review of Systems  Constitutional: Negative.  Negative for chills and fever.  HENT:  Negative for sinus pain.   Respiratory: Negative.  Negative for cough.   Cardiovascular: Negative.   Skin: Negative.  Negative for rash.  Neurological:  Positive for headaches.  All other systems reviewed and are negative.    Observations/Objective: Televisit patient not in distress.  Assessment and Plan: Patient positive for COVID-19 at home test.  Education provided over the phone for antiviral medication use.  Patient verbalized understanding.   Advised patient to take meds as prescribed - Use a cool mist humidifier  -Use saline nose sprays frequently -Force fluids -For fever or aches or pains- take Tylenol or ibuprofen.    Follow Up Instructions: Follow-up with unresolved symptoms.    I discussed the assessment and treatment plan with the patient. The patient was provided an opportunity to ask questions and all were answered. The patient agreed with the plan and demonstrated an understanding of the instructions.   The patient was advised to call back or seek an in-person evaluation if the symptoms worsen or if the condition fails to improve as anticipated.  The above assessment and management plan was discussed with the patient. The patient verbalized understanding of and has agreed to the management plan. Patient is aware to call the clinic if symptoms persist or worsen. Patient is aware when to return to the clinic for a follow-up visit. Patient educated on when it is appropriate to go to the emergency department.   Time call ended:  10:31 am   I provided 11: minutes of  non face-to-face time during this encounter.    Ivy Lynn, NP

## 2022-10-27 ENCOUNTER — Ambulatory Visit (INDEPENDENT_AMBULATORY_CARE_PROVIDER_SITE_OTHER): Payer: Medicare HMO | Admitting: Family

## 2022-10-27 ENCOUNTER — Encounter: Payer: Self-pay | Admitting: Family

## 2022-10-27 VITALS — BP 139/79 | HR 81 | Temp 97.8°F | Ht 63.0 in | Wt 161.0 lb

## 2022-10-27 DIAGNOSIS — E559 Vitamin D deficiency, unspecified: Secondary | ICD-10-CM

## 2022-10-27 DIAGNOSIS — Z0001 Encounter for general adult medical examination with abnormal findings: Secondary | ICD-10-CM

## 2022-10-27 DIAGNOSIS — Z0000 Encounter for general adult medical examination without abnormal findings: Secondary | ICD-10-CM

## 2022-10-27 DIAGNOSIS — K219 Gastro-esophageal reflux disease without esophagitis: Secondary | ICD-10-CM

## 2022-10-27 DIAGNOSIS — M810 Age-related osteoporosis without current pathological fracture: Secondary | ICD-10-CM | POA: Diagnosis not present

## 2022-10-27 DIAGNOSIS — E782 Mixed hyperlipidemia: Secondary | ICD-10-CM | POA: Diagnosis not present

## 2022-10-27 DIAGNOSIS — M1990 Unspecified osteoarthritis, unspecified site: Secondary | ICD-10-CM | POA: Diagnosis not present

## 2022-10-27 LAB — LIPID PANEL

## 2022-10-27 NOTE — Patient Instructions (Signed)

## 2022-10-27 NOTE — Progress Notes (Signed)
Subjective:    Patient ID: Frances Morrison, female    DOB: 09-07-1941, 82 y.o.   MRN: [id number]  Chief Complaint  Patient presents with   Medical Management of Chronic Issues    Fasting no concerns    Pt presents to the office today for CPE and chronic follow up. Her BP is elevated today, but states her home BP this morning before leaving was 139/79.Marland Kitchen   She has osteoporosis and takes Fosamax weekly. States she does not do any weight bearing exercises, except cleaning her home. She takes calcium and vit d daily. Last Dexascan was 10/18/20.    She is complaining of night sweats the last few months. States she will wake up sweating and will get a ice pack. States it only lasts a few mins.  Hypertension This is a chronic problem. The current episode started more than 1 year ago. The problem has been resolved since onset. The problem is controlled. Pertinent negatives include no malaise/fatigue, peripheral edema or shortness of breath. Risk factors for coronary artery disease include dyslipidemia and sedentary lifestyle. The current treatment provides moderate improvement.  Gastroesophageal Reflux She complains of belching and heartburn. This is a chronic problem. The current episode started more than 1 year ago. The problem occurs occasionally. She has tried a PPI for the symptoms. The treatment provided moderate relief.  Arthritis Presents for follow-up visit. She complains of pain and stiffness. The symptoms have been stable. Affected locations include the right knee, left knee, left MCP and right MCP. Her pain is at a severity of 3/10.  Hyperlipidemia This is a chronic problem. The current episode started more than 1 year ago. The problem is controlled. Recent lipid tests were reviewed and are normal. Pertinent negatives include no shortness of breath. Current antihyperlipidemic treatment includes statins. The current treatment provides moderate improvement of lipids. Risk factors for coronary  artery disease include dyslipidemia, hypertension, post-menopausal and a sedentary lifestyle.      Review of Systems  Constitutional:  Negative for malaise/fatigue.  Respiratory:  Negative for shortness of breath.   Gastrointestinal:  Positive for heartburn.  Musculoskeletal:  Positive for arthritis and stiffness.  All other systems reviewed and are negative.  Family History  Problem Relation Age of Onset   Cancer Mother    Cancer Father    Social History   Socioeconomic History   Marital status: Married    Spouse name: Not on file   Number of children: Not on file   Years of education: Not on file   Highest education level: Not on file  Occupational History   Not on file  Tobacco Use   Smoking status: Former    Packs/day: 1.00    Years: 12.00    Total pack years: 12.00    Types: Cigarettes    Quit date: 07/10/2010    Years since quitting: 12.3   Smokeless tobacco: Never  Vaping Use   Vaping Use: Never used  Substance and Sexual Activity   Alcohol use: No   Drug use: No   Sexual activity: Not Currently  Other Topics Concern   Not on file  Social History Narrative   Not on file   Social Determinants of Health   Financial Resource Strain: Not on file  Food Insecurity: Not on file  Transportation Needs: Not on file  Physical Activity: Not on file  Stress: Not on file  Social Connections: Not on file        Objective:  Physical Exam Vitals reviewed.  Constitutional:      General: She is not in acute distress.    Appearance: She is well-developed.  HENT:     Head: Normocephalic and atraumatic.     Right Ear: Tympanic membrane normal.     Left Ear: Tympanic membrane normal.  Eyes:     Pupils: Pupils are equal, round, and reactive to light.  Neck:     Thyroid: No thyromegaly.  Cardiovascular:     Rate and Rhythm: Normal rate and regular rhythm.     Heart sounds: Normal heart sounds. No murmur heard. Pulmonary:     Effort: Pulmonary effort is  normal. No respiratory distress.     Breath sounds: Normal breath sounds. No wheezing.  Abdominal:     General: Bowel sounds are normal. There is no distension.     Palpations: Abdomen is soft.     Tenderness: There is no abdominal tenderness.  Musculoskeletal:        General: No tenderness. Normal range of motion.     Cervical back: Normal range of motion and neck supple.  Skin:    General: Skin is warm and dry.  Neurological:     Mental Status: She is alert and oriented to person, place, and time.     Cranial Nerves: No cranial nerve deficit.     Deep Tendon Reflexes: Reflexes are normal and symmetric.  Psychiatric:        Behavior: Behavior normal.        Thought Content: Thought content normal.        Judgment: Judgment normal.       BP (!) 150/74   Pulse 81   Temp 97.8 F (36.6 C) (Temporal)   Ht '5\' 3"'$  (1.6 m)   Wt 161 lb (73 kg)   SpO2 99%   BMI 28.52 kg/m      Assessment & Plan:   Frances Morrison comes in today with chief complaint of Medical Management of Chronic Issues (Fasting no concerns )   Diagnosis and orders addressed:  1. Arthritis - CMP14+EGFR - CBC with Differential/Platelet  2. Gastroesophageal reflux disease, unspecified whether esophagitis present - CMP14+EGFR - CBC with Differential/Platelet  3. Mixed hyperlipidemia - CMP14+EGFR - CBC with Differential/Platelet  4. Osteoporosis, unspecified osteoporosis type, unspecified pathological fracture presence - CMP14+EGFR - CBC with Differential/Platelet  5. Vitamin D deficiency  - CMP14+EGFR - CBC with Differential/Platelet  6. Annual physical exam - CMP14+EGFR - CBC with Differential/Platelet - Lipid panel - TSH   Labs pending Health Maintenance reviewed Diet and exercise encouraged  Follow up plan: 6 months   [name], FNP

## 2022-10-28 LAB — CMP14+EGFR
ALT: 18 IU/L (ref 0–32)
AST: 23 IU/L (ref 0–40)
Albumin/Globulin Ratio: 1.6 (ref 1.2–2.2)
Albumin: 4.5 g/dL (ref 3.7–4.7)
Alkaline Phosphatase: 87 IU/L (ref 44–121)
BUN/Creatinine Ratio: 24 (ref 12–28)
BUN: 21 mg/dL (ref 8–27)
Bilirubin Total: 0.3 mg/dL (ref 0.0–1.2)
CO2: 25 mmol/L (ref 20–29)
Calcium: 9.7 mg/dL (ref 8.7–10.3)
Chloride: 102 mmol/L (ref 96–106)
Creatinine, Ser: 0.89 mg/dL (ref 0.57–1.00)
Globulin, Total: 2.8 g/dL (ref 1.5–4.5)
Glucose: 101 mg/dL — ABNORMAL HIGH (ref 70–99)
Potassium: 5 mmol/L (ref 3.5–5.2)
Sodium: 142 mmol/L (ref 134–144)
Total Protein: 7.3 g/dL (ref 6.0–8.5)
eGFR: 65 mL/min/{1.73_m2} (ref >59)

## 2022-10-28 LAB — CBC WITH DIFFERENTIAL/PLATELET
Basophils Absolute: 0 10*3/uL (ref 0.0–0.2)
Basos: 1 %
EOS (ABSOLUTE): 0.2 10*3/uL (ref 0.0–0.4)
Eos: 3 %
Hematocrit: 42.4 % (ref 34.0–46.6)
Hemoglobin: 13.8 g/dL (ref 11.1–15.9)
Immature Grans (Abs): 0 10*3/uL (ref 0.0–0.1)
Immature Granulocytes: 0 %
Lymphocytes Absolute: 1.2 10*3/uL (ref 0.7–3.1)
Lymphs: 20 %
MCH: 28.1 pg (ref 26.6–33.0)
MCHC: 32.5 g/dL (ref 31.5–35.7)
MCV: 86 fL (ref 79–97)
Monocytes Absolute: 0.4 10*3/uL (ref 0.1–0.9)
Monocytes: 6 %
Neutrophils Absolute: 4.3 10*3/uL (ref 1.4–7.0)
Neutrophils: 70 %
Platelets: 213 10*3/uL (ref 150–450)
RBC: 4.91 x10E6/uL (ref 3.77–5.28)
RDW: 13.2 % (ref 11.7–15.4)
WBC: 6.2 10*3/uL (ref 3.4–10.8)

## 2022-10-28 LAB — LIPID PANEL
Chol/HDL Ratio: 2.6 ratio (ref 0.0–4.4)
Cholesterol, Total: 182 mg/dL (ref 100–199)
HDL: 69 mg/dL (ref >39)
LDL Chol Calc (NIH): 96 mg/dL (ref 0–99)
Triglycerides: 95 mg/dL (ref 0–149)
VLDL Cholesterol Cal: 17 mg/dL (ref 5–40)

## 2022-10-28 LAB — TSH: TSH: 1.45 u[IU]/mL (ref 0.450–4.500)

## 2022-11-25 DIAGNOSIS — L2082 Flexural eczema: Secondary | ICD-10-CM | POA: Diagnosis not present

## 2022-11-25 DIAGNOSIS — Z85828 Personal history of other malignant neoplasm of skin: Secondary | ICD-10-CM | POA: Diagnosis not present

## 2022-11-25 DIAGNOSIS — L570 Actinic keratosis: Secondary | ICD-10-CM | POA: Diagnosis not present

## 2022-11-25 DIAGNOSIS — D239 Other benign neoplasm of skin, unspecified: Secondary | ICD-10-CM | POA: Diagnosis not present

## 2022-11-25 DIAGNOSIS — Z1283 Encounter for screening for malignant neoplasm of skin: Secondary | ICD-10-CM | POA: Diagnosis not present

## 2022-12-02 ENCOUNTER — Telehealth: Payer: Self-pay | Admitting: Family

## 2022-12-02 NOTE — Telephone Encounter (Signed)
Patient wants to know if Frances Morrison can work her in to be seen. Says she believes her saliva gland is blocked.

## 2022-12-02 NOTE — Telephone Encounter (Signed)
Patient aware and verbalized understanding. °

## 2022-12-02 NOTE — Telephone Encounter (Signed)
Patient aware to ear eat sour candy and lemons and to call and let christy know

## 2023-01-05 ENCOUNTER — Ambulatory Visit (INDEPENDENT_AMBULATORY_CARE_PROVIDER_SITE_OTHER): Payer: Medicare HMO

## 2023-01-05 VITALS — Ht 63.0 in | Wt 162.0 lb

## 2023-01-05 DIAGNOSIS — Z780 Asymptomatic menopausal state: Secondary | ICD-10-CM | POA: Diagnosis not present

## 2023-01-05 DIAGNOSIS — Z0000 Encounter for general adult medical examination without abnormal findings: Secondary | ICD-10-CM

## 2023-01-05 NOTE — Progress Notes (Signed)
Subjective:   Frances Morrison is a 82 y.o. female who presents for an Initial Medicare Annual Wellness Visit. I connected with  [name] on 01/05/23 by a audio enabled telemedicine application and verified that I am speaking with the correct person using two identifiers.  Patient Location: Home  Provider Location: Home Office  I discussed the limitations of evaluation and management by telemedicine. The patient expressed understanding and agreed to proceed.  Review of Systems     Cardiac Risk Factors include: advanced age (>28men, >81 women)     Objective:    Today's Vitals   01/05/23 0851  Weight: 162 lb (73.5 kg)  Height: 5\' 3"  (1.6 m)   Body mass index is 28.7 kg/m.     01/05/2023    8:54 AM 02/02/2020    1:45 PM 08/15/2015    9:56 AM 07/25/2015    1:38 PM 07/11/2015   11:26 AM  Advanced Directives  Does Patient Have a Medical Advance Directive? No No No No No  Would patient like information on creating a medical advance directive? No - Patient declined  No - patient declined information No - patient declined information No - patient declined information    Current Medications (verified) Outpatient Encounter Medications as of 01/05/2023  Medication Sig   Acetaminophen (TYLENOL ARTHRITIS PAIN PO) Take by mouth as needed.   alendronate (FOSAMAX) 70 MG tablet TAKE 1 TABLET WEEKLY ON AN EMPTY STOMACH WITH FULL GLASS OF WATER   amLODipine (NORVASC) 5 MG tablet TAKE 1 TABLET DAILY   cetirizine (ZYRTEC) 10 MG tablet Take 1 tablet (10 mg total) by mouth daily.   esomeprazole (NEXIUM) 40 MG capsule TAKE 1 CAPSULE DAILY BEFORE BREAKFAST   meloxicam (MOBIC) 7.5 MG tablet TAKE 1 TABLET DAILY   Multiple Vitamins-Minerals (CENTRUM SILVER 50+WOMEN) TABS Take by mouth daily.   No facility-administered encounter medications on file as of 01/05/2023.    Allergies (verified) Codeine, Latex, Statins, Sulfa antibiotics, and Betadine [povidone iodine]   History: Past Medical History:   Diagnosis Date   Arthritis    arthritis -feet, hands knee   Cervical cancer    GERD (gastroesophageal reflux disease)    Hypertension    PONV (postoperative nausea and vomiting)    Past Surgical History:  Procedure Laterality Date   ABDOMINAL HYSTERECTOMY     APPENDECTOMY     CATARACT EXTRACTION, BILATERAL Bilateral    CHOLECYSTECTOMY     TONSILLECTOMY     TOTAL KNEE ARTHROPLASTY Right 07/25/2015   Procedure: TOTAL RIGHT KNEE ARTHROPLASTY;  Surgeon: Latanya Maudlin, MD;  Location: [facility];  Service: Orthopedics;  Laterality: Right;   Family History  Problem Relation Age of Onset   Cancer Mother    Cancer Father    Social History   Socioeconomic History   Marital status: Married    Spouse name: Not on file   Number of children: Not on file   Years of education: Not on file   Highest education level: Not on file  Occupational History   Not on file  Tobacco Use   Smoking status: Former    Packs/day: 1.00    Years: 12.00    Additional pack years: 0.00    Total pack years: 12.00    Types: Cigarettes    Quit date: 07/10/2010    Years since quitting: 12.4   Smokeless tobacco: Never  Vaping Use   Vaping Use: Never used  Substance and Sexual Activity   Alcohol use: No  Drug use: No   Sexual activity: Not Currently  Other Topics Concern   Not on file  Social History Narrative   Not on file   Social Determinants of Health   Financial Resource Strain: Low Risk  (01/05/2023)   Overall Financial Resource Strain (CARDIA)    Difficulty of Paying Living Expenses: Not hard at all  Food Insecurity: No Food Insecurity (01/05/2023)   Hunger Vital Sign    Worried About Running Out of Food in the Last Year: Never true    Ran Out of Food in the Last Year: Never true  Transportation Needs: No Transportation Needs (01/05/2023)   PRAPARE - Hydrologist (Medical): No    Lack of Transportation (Non-Medical): No  Physical Activity: Insufficiently Active  (01/05/2023)   Exercise Vital Sign    Days of Exercise per Week: 3 days    Minutes of Exercise per Session: 30 min  Stress: No Stress Concern Present (01/05/2023)   [place]    Feeling of Stress : Not at all  Social Connections: Moderately Isolated (01/05/2023)   Social Connection and Isolation Panel [NHANES]    Frequency of Communication with Friends and Family: More than three times a week    Frequency of Social Gatherings with Friends and Family: More than three times a week    Attends Religious Services: Never    Marine scientist or Organizations: No    Attends Music therapist: Never    Marital Status: Married    Tobacco Counseling Counseling given: Not Answered   Clinical Intake:  Pre-visit preparation completed: Yes  Pain : No/denies pain     Nutritional Risks: None Diabetes: No  How often do you need to have someone help you when you read instructions, pamphlets, or other written materials from your doctor or pharmacy?: 1 - Never  Diabetic?no   Interpreter Needed?: No  Information entered by :: Jadene Pierini, LPN   Activities of Daily Living    01/05/2023    8:54 AM  In your present state of health, do you have any difficulty performing the following activities:  Hearing? 0  Vision? 0  Difficulty concentrating or making decisions? 0  Walking or climbing stairs? 0  Dressing or bathing? 0  Doing errands, shopping? 0  Preparing Food and eating ? N  Using the Toilet? N  In the past six months, have you accidently leaked urine? N  Do you have problems with loss of bowel control? N  Managing your Medications? N  Managing your Finances? N  Housekeeping or managing your Housekeeping? N    Patient Care Team: Sharion Balloon, FNP as PCP - General (Family Medicine)  Indicate any recent Medical Services you may have received from other than [facility] providers in the past year (date  may be approximate).     Assessment:   This is a routine wellness examination for [place].  Hearing/Vision screen Vision Screening - Comments:: Wears rx glasses - up to date with routine eye exams with  Dr.Lee   Dietary issues and exercise activities discussed: Current Exercise Habits: Home exercise routine, Type of exercise: walking, Time (Minutes): 30, Frequency (Times/Week): 3, Weekly Exercise (Minutes/Week): 90, Intensity: Mild, Exercise limited by: None identified   Goals Addressed             This Visit's Progress    Exercise 3x per week (30 min per time)  Depression Screen    01/05/2023    8:53 AM 10/27/2022    8:07 AM 04/24/2022    8:50 AM 10/25/2021   10:33 AM 04/18/2021    8:26 AM 10/18/2020    8:23 AM 06/18/2020   11:14 AM  PHQ 2/9 Scores  PHQ - 2 Score 0 0 0 0 0 0 0  PHQ- 9 Score  0   0      Fall Risk    01/05/2023    8:52 AM 10/27/2022    8:07 AM 04/24/2022    8:50 AM 10/25/2021   10:32 AM 04/18/2021    8:26 AM  Fall Risk   Falls in the past year? 0 0 0 0 0  Number falls in past yr: 0      Injury with Fall? 0      Risk for fall due to : No Fall Risks      Follow up Falls prevention discussed        [place]:  Any stairs in or around the home? No  If so, are there any without handrails? No  Home free of loose throw rugs in walkways, pet beds, electrical cords, etc? Yes  Adequate lighting in your home to reduce risk of falls? Yes   ASSISTIVE DEVICES UTILIZED TO PREVENT FALLS:  Life alert? No  Use of a cane, walker or w/c? No  Grab bars in the bathroom? No  Shower chair or bench in shower? No  Elevated toilet seat or a handicapped toilet? No        01/05/2023    8:54 AM  6CIT Screen  What Year? 0 points  What month? 0 points  What time? 0 points  Count back from 20 0 points  Months in reverse 0 points  Repeat phrase 0 points  Total Score 0 points    Immunizations Immunization History  Administered  Date(s) Administered   Fluad Quad(high Dose 65+) 11/04/2016, 10/26/2017, 07/14/2018, 09/27/2019, 07/18/2021, 08/13/2022   Influenza, High Dose Seasonal PF 11/04/2016, 10/26/2017, 07/14/2018   Influenza,inj,Quad PF,6+ Mos 11/04/2016, 10/26/2017, 07/14/2018   Influenza,trivalent, recombinat, inj, PF 09/13/2014   Influenza-Unspecified 09/13/2014, 11/04/2016, 10/26/2017, 07/14/2018, 08/13/2020   Moderna Covid-19 Vaccine Bivalent Booster 59yrs & up 08/06/2021   Moderna Sars-Covid-2 Vaccination 12/07/2019, 01/04/2020, 09/11/2020   Pneumococcal Conjugate-13 11/04/2016   Pneumococcal Polysaccharide-23 07/06/2014   Td 02/12/2005   Td (Adult),5 Lf Tetanus Toxid, Preservative Free 02/12/2005   Tdap 02/12/2005, 09/27/2019   Tetanus 02/12/2005   Zoster Recombinat (Shingrix) 10/25/2021, 04/24/2022    TDAP status: Up to date  Flu Vaccine status: Up to date  Pneumococcal vaccine status: Up to date  Covid-19 vaccine status: Completed vaccines  Qualifies for Shingles Vaccine? Yes   Zostavax completed Yes   Shingrix Completed?: Yes  Screening Tests Health Maintenance  Topic Date Due   COVID-19 Vaccine (5 - 2023-24 season) 06/06/2022   DEXA SCAN  10/18/2022   INFLUENZA VACCINE  05/07/2023   Medicare Annual Wellness (AWV)  01/05/2024   DTaP/Tdap/Td (5 - Td or Tdap) 09/26/2029   Pneumonia Vaccine 66+ Years old  Completed   Zoster Vaccines- Shingrix  Completed   HPV VACCINES  Aged Out    Health Maintenance  Health Maintenance Due  Topic Date Due   COVID-19 Vaccine (5 - 2023-24 season) 06/06/2022   DEXA SCAN  10/18/2022    Colorectal cancer screening: No longer required.   Mammogram status: No longer required due to age.  Bone  Density status: Ordered 01/05/2023. Pt provided with contact info and advised to call to schedule appt.  Lung Cancer Screening: (Low Dose CT Chest recommended if Age 2-80 years, 30 pack-year currently smoking OR have quit w/in 15years.) does not qualify.    Lung Cancer Screening Referral: n/a  Additional Screening:  Hepatitis C Screening: does not qualify;   Vision Screening: Recommended annual ophthalmology exams for early detection of glaucoma and other disorders of the eye. Is the patient up to date with their annual eye exam?  Yes  Who is the provider or what is the name of the office in which the patient attends annual eye exams? Dr.Lee  If pt is not established with a provider, would they like to be referred to a provider to establish care? No .   Dental Screening: Recommended annual dental exams for proper oral hygiene  Community Resource Referral / Chronic Care Management: CRR required this visit?  No   CCM required this visit?  No      Plan:     I have personally reviewed and noted the following in the patient's chart:   Medical and social history Use of alcohol, tobacco or illicit drugs  Current medications and supplements including opioid prescriptions. Patient is not currently taking opioid prescriptions. Functional ability and status Nutritional status Physical activity Advanced directives List of other physicians Hospitalizations, surgeries, and ER visits in previous 12 months Vitals Screenings to include cognitive, depression, and falls Referrals and appointments  In addition, I have reviewed and discussed with patient certain preventive protocols, quality metrics, and best practice recommendations. A written personalized care plan for preventive services as well as general preventive health recommendations were provided to patient.     Daphane Shepherd, LPN   [id number]   Nurse Notes: none

## 2023-01-08 ENCOUNTER — Other Ambulatory Visit: Payer: Self-pay | Admitting: Family

## 2023-01-08 DIAGNOSIS — I10 Essential (primary) hypertension: Secondary | ICD-10-CM

## 2023-01-08 DIAGNOSIS — M810 Age-related osteoporosis without current pathological fracture: Secondary | ICD-10-CM

## 2023-01-08 DIAGNOSIS — Z1231 Encounter for screening mammogram for malignant neoplasm of breast: Secondary | ICD-10-CM | POA: Diagnosis not present

## 2023-01-08 DIAGNOSIS — K219 Gastro-esophageal reflux disease without esophagitis: Secondary | ICD-10-CM

## 2023-01-08 DIAGNOSIS — M1990 Unspecified osteoarthritis, unspecified site: Secondary | ICD-10-CM

## 2023-01-20 ENCOUNTER — Encounter: Payer: Self-pay | Admitting: Family

## 2023-04-14 ENCOUNTER — Other Ambulatory Visit: Payer: Self-pay | Admitting: Family

## 2023-04-14 DIAGNOSIS — M1990 Unspecified osteoarthritis, unspecified site: Secondary | ICD-10-CM

## 2023-04-14 DIAGNOSIS — I10 Essential (primary) hypertension: Secondary | ICD-10-CM

## 2023-04-14 DIAGNOSIS — K219 Gastro-esophageal reflux disease without esophagitis: Secondary | ICD-10-CM

## 2023-04-28 ENCOUNTER — Ambulatory Visit (INDEPENDENT_AMBULATORY_CARE_PROVIDER_SITE_OTHER): Payer: Medicare HMO

## 2023-04-28 ENCOUNTER — Encounter: Payer: Self-pay | Admitting: Family

## 2023-04-28 ENCOUNTER — Ambulatory Visit (INDEPENDENT_AMBULATORY_CARE_PROVIDER_SITE_OTHER): Payer: Medicare HMO | Admitting: Family

## 2023-04-28 ENCOUNTER — Other Ambulatory Visit: Payer: Self-pay | Admitting: Family

## 2023-04-28 VITALS — BP 116/60 | HR 65 | Temp 97.7°F | Ht 63.0 in | Wt 163.2 lb

## 2023-04-28 DIAGNOSIS — M810 Age-related osteoporosis without current pathological fracture: Secondary | ICD-10-CM | POA: Diagnosis not present

## 2023-04-28 DIAGNOSIS — Z780 Asymptomatic menopausal state: Secondary | ICD-10-CM

## 2023-04-28 DIAGNOSIS — K219 Gastro-esophageal reflux disease without esophagitis: Secondary | ICD-10-CM | POA: Diagnosis not present

## 2023-04-28 DIAGNOSIS — E782 Mixed hyperlipidemia: Secondary | ICD-10-CM

## 2023-04-28 DIAGNOSIS — M1990 Unspecified osteoarthritis, unspecified site: Secondary | ICD-10-CM

## 2023-04-28 DIAGNOSIS — E559 Vitamin D deficiency, unspecified: Secondary | ICD-10-CM | POA: Diagnosis not present

## 2023-04-28 DIAGNOSIS — I10 Essential (primary) hypertension: Secondary | ICD-10-CM

## 2023-04-28 LAB — CBC WITH DIFFERENTIAL/PLATELET
Basophils Absolute: 0 10*3/uL (ref 0.0–0.2)
Basos: 1 %
EOS (ABSOLUTE): 0.2 10*3/uL (ref 0.0–0.4)
Hematocrit: 36.6 % (ref 34.0–46.6)
Immature Grans (Abs): 0 10*3/uL (ref 0.0–0.1)
Immature Granulocytes: 1 %
Lymphs: 23 %
MCH: 28.7 pg (ref 26.6–33.0)
MCHC: 32.2 g/dL (ref 31.5–35.7)
Monocytes: 8 %
Neutrophils Absolute: 3.9 10*3/uL (ref 1.4–7.0)
RBC: 4.11 x10E6/uL (ref 3.77–5.28)
WBC: 6 10*3/uL (ref 3.4–10.8)

## 2023-04-28 LAB — CMP14+EGFR

## 2023-04-28 NOTE — Patient Instructions (Addendum)
Health Maintenance After Age 83 After age 83, you are at a higher risk for certain long-term diseases and infections as well as injuries from falls. Falls are a major cause of broken bones and head injuries in people who are older than age 83. Getting regular preventive care can help to keep you healthy and well. Preventive care includes getting regular testing and making lifestyle changes as recommended by your health care provider. Talk with your health care provider about: Which screenings and tests you should have. A screening is a test that checks for a disease when you have no symptoms. A diet and exercise plan that is right for you. What should I know about screenings and tests to prevent falls? Screening and testing are the best ways to find a health problem early. Early diagnosis and treatment give you the best chance of managing medical conditions that are common after age 83. Certain conditions and lifestyle choices may make you more likely to have a fall. Your health care provider may recommend: Regular vision checks. Poor vision and conditions such as cataracts can make you more likely to have a fall. If you wear glasses, make sure to get your prescription updated if your vision changes. Medicine review. Work with your health care provider to regularly review all of the medicines you are taking, including over-the-counter medicines. Ask your health care provider about any side effects that may make you more likely to have a fall. Tell your health care provider if any medicines that you take make you feel dizzy or sleepy. Strength and balance checks. Your health care provider may recommend certain tests to check your strength and balance while standing, walking, or changing positions. Foot health exam. Foot pain and numbness, as well as not wearing proper footwear, can make you more likely to have a fall. Screenings, including: Osteoporosis screening. Osteoporosis is a condition that causes  the bones to get weaker and break more easily. Blood pressure screening. Blood pressure changes and medicines to control blood pressure can make you feel dizzy. Depression screening. You may be more likely to have a fall if you have a fear of falling, feel depressed, or feel unable to do activities that you used to do. Alcohol use screening. Using too much alcohol can affect your balance and may make you more likely to have a fall. Follow these instructions at home: Lifestyle Do not drink alcohol if: Your health care provider tells you not to drink. If you drink alcohol: Limit how much you have to: 0-1 drink a day for women. 0-2 drinks a day for men. Know how much alcohol is in your drink. In the [place] one drink equals one 12 oz bottle of beer (355 mL), one 5 oz glass of wine (148 mL), or one 1 oz glass of hard liquor (44 mL). Do not use any products that contain nicotine or tobacco. These products include cigarettes, chewing tobacco, and vaping devices, such as e-cigarettes. If you need help quitting, ask your health care provider. Activity  Follow a regular exercise program to stay fit. This will help you maintain your balance. Ask your health care provider what types of exercise are appropriate for you. If you need a cane or walker, use it as recommended by your health care provider. Wear supportive shoes that have nonskid soles. Safety  Remove any tripping hazards, such as rugs, cords, and clutter. Install safety equipment such as grab bars in bathrooms and safety rails on stairs. Keep rooms and walkways   well-lit. General instructions Talk with your health care provider about your risks for falling. Tell your health care provider if: You fall. Be sure to tell your health care provider about all falls, even ones that seem minor. You feel dizzy, tiredness (fatigue), or off-balance. Take over-the-counter and prescription medicines only as told by your health care provider. These include  supplements. Eat a healthy diet and maintain a healthy weight. A healthy diet includes low-fat dairy products, low-fat (lean) meats, and fiber from whole grains, beans, and lots of fruits and vegetables. Stay current with your vaccines. Schedule regular health, dental, and eye exams. Summary Having a healthy lifestyle and getting preventive care can help to protect your health and wellness after age 83. Screening and testing are the best way to find a health problem early and help you avoid having a fall. Early diagnosis and treatment give you the best chance for managing medical conditions that are more common for people who are older than age 83. Falls are a major cause of broken bones and head injuries in people who are older than age 83. Take precautions to prevent a fall at home. Work with your health care provider to learn what changes you can make to improve your health and wellness and to prevent falls. This information is not intended to replace advice given to you by your health care provider. Make sure you discuss any questions you have with your health care provider. Document Revised: 02/11/2021 Document Reviewed: 02/11/2021 Elsevier Patient Education  2024 [employer]  

## 2023-04-28 NOTE — Progress Notes (Signed)
Subjective:    Patient ID: Frances Morrison, female    DOB: 17-Mar-1941, 82 y.o.   MRN: [id number]  Chief Complaint  Patient presents with   Medical Management of Chronic Issues    No concerns patient is fasting   Pt presents to the office today for chronic follow up.   She has osteoporosis and takes Fosamax weekly. States she does not do any weight bearing exercises, except cleaning her home. She takes calcium and vit d daily. Last Dexascan was 10/18/20.    She is complaining of night sweats the last few months. States she will wake up sweating and will get a ice pack. States it only lasts a few mins.  Hypertension This is a chronic problem. The current episode started more than 1 year ago. The problem has been resolved since onset. The problem is controlled. Pertinent negatives include no malaise/fatigue, peripheral edema or shortness of breath. Risk factors for coronary artery disease include dyslipidemia, obesity and sedentary lifestyle. The current treatment provides moderate improvement.  Gastroesophageal Reflux She complains of belching and heartburn. This is a chronic problem. The current episode started more than 1 year ago. The problem occurs occasionally. Risk factors include obesity. She has tried a PPI for the symptoms. The treatment provided moderate relief.  Arthritis Presents for follow-up visit. She complains of pain and stiffness. Affected locations include the left MCP, right MCP, left foot and right foot. Her pain is at a severity of 4/10.  Hyperlipidemia This is a chronic problem. The current episode started more than 1 year ago. The problem is controlled. Recent lipid tests were reviewed and are normal. Exacerbating diseases include obesity. Pertinent negatives include no shortness of breath. Current antihyperlipidemic treatment includes diet change. The current treatment provides mild improvement of lipids. Risk factors for coronary artery disease include dyslipidemia,  hypertension, a sedentary lifestyle and post-menopausal.      Review of Systems  Constitutional:  Negative for malaise/fatigue.  Respiratory:  Negative for shortness of breath.   Gastrointestinal:  Positive for heartburn.  Musculoskeletal:  Positive for arthritis and stiffness.  All other systems reviewed and are negative.      Objective:   Physical Exam Vitals reviewed.  Constitutional:      General: She is not in acute distress.    Appearance: She is well-developed.  HENT:     Head: Normocephalic and atraumatic.     Right Ear: Tympanic membrane normal.     Left Ear: Tympanic membrane normal.  Eyes:     Pupils: Pupils are equal, round, and reactive to light.  Neck:     Thyroid: No thyromegaly.  Cardiovascular:     Rate and Rhythm: Normal rate and regular rhythm.     Heart sounds: Normal heart sounds. No murmur heard. Pulmonary:     Effort: Pulmonary effort is normal. No respiratory distress.     Breath sounds: Normal breath sounds. No wheezing.  Abdominal:     General: Bowel sounds are normal. There is no distension.     Palpations: Abdomen is soft.     Tenderness: There is no abdominal tenderness.  Musculoskeletal:        General: No tenderness. Normal range of motion.     Cervical back: Normal range of motion and neck supple.  Skin:    General: Skin is warm and dry.  Neurological:     Mental Status: She is alert and oriented to person, place, and time.     Cranial Nerves: No  cranial nerve deficit.     Deep Tendon Reflexes: Reflexes are normal and symmetric.  Psychiatric:        Behavior: Behavior normal.        Thought Content: Thought content normal.        Judgment: Judgment normal.      BP 116/60   Pulse 65   Temp 97.7 F (36.5 C) (Temporal)   Ht 5\' 3"  (1.6 m)   Wt 163 lb 3.2 oz (74 kg)   SpO2 97%   BMI 28.91 kg/m      Assessment & Plan:  Frances Morrison comes in today with chief complaint of Medical Management of Chronic Issues (No concerns  patient is fasting)   Diagnosis and orders addressed:  1. Primary hypertension - CMP14+EGFR - CBC with Differential/Platelet  2. Vitamin D deficiency - CMP14+EGFR - CBC with Differential/Platelet  3. Osteoporosis, unspecified osteoporosis type, unspecified pathological fracture presence - CMP14+EGFR - CBC with Differential/Platelet  4. Mixed hyperlipidemia - CMP14+EGFR - CBC with Differential/Platelet  5. Gastroesophageal reflux disease, unspecified whether esophagitis present - CMP14+EGFR - CBC with Differential/Platelet  6. Arthritis - CMP14+EGFR - CBC with Differential/Platelet   Labs pending Continue all medications  Health Maintenance reviewed Diet and exercise encouraged  Follow up plan: 6 months   Jannifer Rodney, FNP

## 2023-04-29 LAB — CBC WITH DIFFERENTIAL/PLATELET
Eos: 3 %
Hemoglobin: 11.8 g/dL (ref 11.1–15.9)
Lymphocytes Absolute: 1.4 10*3/uL (ref 0.7–3.1)
MCV: 89 fL (ref 79–97)
Monocytes Absolute: 0.5 10*3/uL (ref 0.1–0.9)
Neutrophils: 64 %
Platelets: 192 10*3/uL (ref 150–450)
RDW: 13.6 % (ref 11.7–15.4)

## 2023-04-29 LAB — CMP14+EGFR
ALT: 17 IU/L (ref 0–32)
AST: 24 IU/L (ref 0–40)
Alkaline Phosphatase: 80 IU/L (ref 44–121)
BUN/Creatinine Ratio: 22 (ref 12–28)
BUN: 19 mg/dL (ref 8–27)
Bilirubin Total: 0.2 mg/dL (ref 0.0–1.2)
CO2: 24 mmol/L (ref 20–29)
Calcium: 9.4 mg/dL (ref 8.7–10.3)
Chloride: 105 mmol/L (ref 96–106)
Globulin, Total: 2.5 g/dL (ref 1.5–4.5)
Potassium: 4.4 mmol/L (ref 3.5–5.2)
Sodium: 143 mmol/L (ref 134–144)
Total Protein: 6.7 g/dL (ref 6.0–8.5)
eGFR: 68 mL/min/{1.73_m2} (ref >59)

## 2023-07-14 ENCOUNTER — Other Ambulatory Visit: Payer: Self-pay | Admitting: Family

## 2023-07-14 DIAGNOSIS — I10 Essential (primary) hypertension: Secondary | ICD-10-CM

## 2023-07-14 DIAGNOSIS — M810 Age-related osteoporosis without current pathological fracture: Secondary | ICD-10-CM

## 2023-07-14 DIAGNOSIS — K219 Gastro-esophageal reflux disease without esophagitis: Secondary | ICD-10-CM

## 2023-07-14 DIAGNOSIS — M1990 Unspecified osteoarthritis, unspecified site: Secondary | ICD-10-CM

## 2023-10-14 ENCOUNTER — Other Ambulatory Visit: Payer: Self-pay | Admitting: Family

## 2023-10-14 DIAGNOSIS — K219 Gastro-esophageal reflux disease without esophagitis: Secondary | ICD-10-CM

## 2023-10-14 DIAGNOSIS — M1990 Unspecified osteoarthritis, unspecified site: Secondary | ICD-10-CM

## 2023-10-14 DIAGNOSIS — I10 Essential (primary) hypertension: Secondary | ICD-10-CM

## 2023-10-14 DIAGNOSIS — M810 Age-related osteoporosis without current pathological fracture: Secondary | ICD-10-CM

## 2023-10-30 ENCOUNTER — Ambulatory Visit (INDEPENDENT_AMBULATORY_CARE_PROVIDER_SITE_OTHER): Payer: Medicare HMO

## 2023-10-30 ENCOUNTER — Ambulatory Visit (INDEPENDENT_AMBULATORY_CARE_PROVIDER_SITE_OTHER): Payer: Medicare HMO | Admitting: Family

## 2023-10-30 ENCOUNTER — Encounter: Payer: Self-pay | Admitting: Family

## 2023-10-30 VITALS — BP 147/70 | HR 71 | Temp 97.5°F | Ht 63.0 in | Wt 165.0 lb

## 2023-10-30 DIAGNOSIS — I10 Essential (primary) hypertension: Secondary | ICD-10-CM

## 2023-10-30 DIAGNOSIS — E782 Mixed hyperlipidemia: Secondary | ICD-10-CM

## 2023-10-30 DIAGNOSIS — R2242 Localized swelling, mass and lump, left lower limb: Secondary | ICD-10-CM

## 2023-10-30 DIAGNOSIS — M19072 Primary osteoarthritis, left ankle and foot: Secondary | ICD-10-CM | POA: Diagnosis not present

## 2023-10-30 DIAGNOSIS — K219 Gastro-esophageal reflux disease without esophagitis: Secondary | ICD-10-CM

## 2023-10-30 DIAGNOSIS — Z0001 Encounter for general adult medical examination with abnormal findings: Secondary | ICD-10-CM

## 2023-10-30 DIAGNOSIS — M7989 Other specified soft tissue disorders: Secondary | ICD-10-CM | POA: Diagnosis not present

## 2023-10-30 DIAGNOSIS — M1990 Unspecified osteoarthritis, unspecified site: Secondary | ICD-10-CM | POA: Diagnosis not present

## 2023-10-30 DIAGNOSIS — M2142 Flat foot [pes planus] (acquired), left foot: Secondary | ICD-10-CM | POA: Diagnosis not present

## 2023-10-30 DIAGNOSIS — E559 Vitamin D deficiency, unspecified: Secondary | ICD-10-CM | POA: Diagnosis not present

## 2023-10-30 DIAGNOSIS — M810 Age-related osteoporosis without current pathological fracture: Secondary | ICD-10-CM

## 2023-10-30 DIAGNOSIS — Z0000 Encounter for general adult medical examination without abnormal findings: Secondary | ICD-10-CM

## 2023-10-30 LAB — LIPID PANEL

## 2023-10-30 NOTE — Patient Instructions (Signed)
Health Maintenance After Age 84 After age 41, you are at a higher risk for certain long-term diseases and infections as well as injuries from falls. Falls are a major cause of broken bones and head injuries in people who are older than age 26. Getting regular preventive care can help to keep you healthy and well. Preventive care includes getting regular testing and making lifestyle changes as recommended by your health care provider. Talk with your health care provider about: Which screenings and tests you should have. A screening is a test that checks for a disease when you have no symptoms. A diet and exercise plan that is right for you. What should I know about screenings and tests to prevent falls? Screening and testing are the best ways to find a health problem early. Early diagnosis and treatment give you the best chance of managing medical conditions that are common after age 48. Certain conditions and lifestyle choices may make you more likely to have a fall. Your health care provider may recommend: Regular vision checks. Poor vision and conditions such as cataracts can make you more likely to have a fall. If you wear glasses, make sure to get your prescription updated if your vision changes. Medicine review. Work with your health care provider to regularly review all of the medicines you are taking, including over-the-counter medicines. Ask your health care provider about any side effects that may make you more likely to have a fall. Tell your health care provider if any medicines that you take make you feel dizzy or sleepy. Strength and balance checks. Your health care provider may recommend certain tests to check your strength and balance while standing, walking, or changing positions. Foot health exam. Foot pain and numbness, as well as not wearing proper footwear, can make you more likely to have a fall. Screenings, including: Osteoporosis screening. Osteoporosis is a condition that causes  the bones to get weaker and break more easily. Blood pressure screening. Blood pressure changes and medicines to control blood pressure can make you feel dizzy. Depression screening. You may be more likely to have a fall if you have a fear of falling, feel depressed, or feel unable to do activities that you used to do. Alcohol use screening. Using too much alcohol can affect your balance and may make you more likely to have a fall. Follow these instructions at home: Lifestyle Do not drink alcohol if: Your health care provider tells you not to drink. If you drink alcohol: Limit how much you have to: 0-1 drink a day for women. 0-2 drinks a day for men. Know how much alcohol is in your drink. In the [place] one drink equals one 12 oz bottle of beer (355 mL), one 5 oz glass of wine (148 mL), or one 1 oz glass of hard liquor (44 mL). Do not use any products that contain nicotine or tobacco. These products include cigarettes, chewing tobacco, and vaping devices, such as e-cigarettes. If you need help quitting, ask your health care provider. Activity  Follow a regular exercise program to stay fit. This will help you maintain your balance. Ask your health care provider what types of exercise are appropriate for you. If you need a cane or walker, use it as recommended by your health care provider. Wear supportive shoes that have nonskid soles. Safety  Remove any tripping hazards, such as rugs, cords, and clutter. Install safety equipment such as grab bars in bathrooms and safety rails on stairs. Keep rooms and walkways  well-lit. General instructions Talk with your health care provider about your risks for falling. Tell your health care provider if: You fall. Be sure to tell your health care provider about all falls, even ones that seem minor. You feel dizzy, tiredness (fatigue), or off-balance. Take over-the-counter and prescription medicines only as told by your health care provider. These include  supplements. Eat a healthy diet and maintain a healthy weight. A healthy diet includes low-fat dairy products, low-fat (lean) meats, and fiber from whole grains, beans, and lots of fruits and vegetables. Stay current with your vaccines. Schedule regular health, dental, and eye exams. Summary Having a healthy lifestyle and getting preventive care can help to protect your health and wellness after age 24. Screening and testing are the best way to find a health problem early and help you avoid having a fall. Early diagnosis and treatment give you the best chance for managing medical conditions that are more common for people who are older than age 81. Falls are a major cause of broken bones and head injuries in people who are older than age 75. Take precautions to prevent a fall at home. Work with your health care provider to learn what changes you can make to improve your health and wellness and to prevent falls. This information is not intended to replace advice given to you by your health care provider. Make sure you discuss any questions you have with your health care provider. Document Revised: 02/11/2021 Document Reviewed: 02/11/2021 Elsevier Patient Education  2024 [employer].

## 2023-10-30 NOTE — Progress Notes (Signed)
Subjective:    Patient ID: Frances Morrison, female    DOB: 1941-02-11, 83 y.o.   MRN: [id number]  Chief Complaint  Patient presents with   Medical Management of Chronic Issues    Spot left foot spot on the bottom of foot causes her problems for walking    Pt presents to the office today for CPE and chronic follow up.   She has osteoporosis and takes Fosamax weekly. States she does not do any weight bearing exercises, except cleaning her home. She takes calcium and vit d daily. Last Dexascan was 04/28/23.    She is complaining of swelling of plantar foot. States she noticed it a month ago and denies any pain or redness.  Hypertension This is a chronic problem. The current episode started more than 1 year ago. The problem has been resolved since onset. The problem is controlled. Pertinent negatives include no malaise/fatigue, peripheral edema or shortness of breath. Risk factors for coronary artery disease include dyslipidemia, obesity and sedentary lifestyle. The current treatment provides moderate improvement.  Gastroesophageal Reflux She complains of belching and heartburn. This is a chronic problem. The current episode started more than 1 year ago. The problem occurs occasionally. Risk factors include obesity. She has tried a PPI for the symptoms. The treatment provided moderate relief.  Arthritis Presents for follow-up visit. She complains of pain and stiffness. Affected locations include the left MCP, right MCP, left foot and right foot. Her pain is at a severity of 9/10.  Hyperlipidemia This is a chronic problem. The current episode started more than 1 year ago. The problem is controlled. Recent lipid tests were reviewed and are normal. Exacerbating diseases include obesity. Pertinent negatives include no shortness of breath. Current antihyperlipidemic treatment includes diet change. The current treatment provides mild improvement of lipids. Risk factors for coronary artery disease include  dyslipidemia, hypertension, a sedentary lifestyle and post-menopausal.      Review of Systems  Constitutional:  Negative for malaise/fatigue.  Respiratory:  Negative for shortness of breath.   Gastrointestinal:  Positive for heartburn.  Musculoskeletal:  Positive for stiffness.  All other systems reviewed and are negative.      Objective:   Physical Exam Vitals reviewed.  Constitutional:      General: She is not in acute distress.    Appearance: She is well-developed.  HENT:     Head: Normocephalic and atraumatic.     Right Ear: Tympanic membrane normal.     Left Ear: Tympanic membrane normal.  Eyes:     Pupils: Pupils are equal, round, and reactive to light.  Neck:     Thyroid: No thyromegaly.  Cardiovascular:     Rate and Rhythm: Normal rate and regular rhythm.     Heart sounds: Normal heart sounds. No murmur heard. Pulmonary:     Effort: Pulmonary effort is normal. No respiratory distress.     Breath sounds: Normal breath sounds. No wheezing.  Abdominal:     General: Bowel sounds are normal. There is no distension.     Palpations: Abdomen is soft.     Tenderness: There is no abdominal tenderness.  Musculoskeletal:        General: No tenderness. Normal range of motion.     Cervical back: Normal range of motion and neck supple.       Feet:  Feet:     Comments: Swollen area, no erythemas, heat or pain noted Skin:    General: Skin is warm and dry.  Neurological:  Mental Status: She is alert and oriented to person, place, and time.     Cranial Nerves: No cranial nerve deficit.     Deep Tendon Reflexes: Reflexes are normal and symmetric.  Psychiatric:        Behavior: Behavior normal.        Thought Content: Thought content normal.        Judgment: Judgment normal.      BP (!) 147/70   Pulse 71   Temp (!) 97.5 F (36.4 C) (Temporal)   Ht 5\' 3"  (1.6 m)   Wt 165 lb (74.8 kg)   SpO2 98%   BMI 29.23 kg/m      Assessment & Plan:  HARTLEE AMEDEE comes in  today with chief complaint of Medical Management of Chronic Issues (Spot left foot spot on the bottom of foot causes her problems for walking )   Diagnosis and orders addressed:  1. Osteoporosis, unspecified osteoporosis type, unspecified pathological fracture presence - CMP14+EGFR  2. Primary hypertension - CMP14+EGFR  3. Gastroesophageal reflux disease, unspecified whether esophagitis present - CMP14+EGFR  4. Arthritis - CMP14+EGFR  5. Annual physical exam (Primary) - CMP14+EGFR - CBC with Differential/Platelet - Lipid panel  6. Mixed hyperlipidemia - CMP14+EGFR - Lipid panel  7. Vitamin D deficiency - CMP14+EGFR  8. Localized swelling of left foot -Rest Ice Motrin as needed  - DG Foot Complete Left; Future  Labs pending Continue all medications  Health Maintenance reviewed Diet and exercise encouraged  Follow up plan: 6 months   Jannifer Rodney, FNP

## 2023-10-31 LAB — CBC WITH DIFFERENTIAL/PLATELET
Basophils Absolute: 0.1 10*3/uL (ref 0.0–0.2)
Basos: 1 %
EOS (ABSOLUTE): 0.1 10*3/uL (ref 0.0–0.4)
Eos: 2 %
Hematocrit: 41 % (ref 34.0–46.6)
Hemoglobin: 13.4 g/dL (ref 11.1–15.9)
Immature Grans (Abs): 0 10*3/uL (ref 0.0–0.1)
Immature Granulocytes: 0 %
Lymphocytes Absolute: 1.3 10*3/uL (ref 0.7–3.1)
Lymphs: 20 %
MCH: 29.4 pg (ref 26.6–33.0)
MCHC: 32.7 g/dL (ref 31.5–35.7)
MCV: 90 fL (ref 79–97)
Monocytes Absolute: 0.4 10*3/uL (ref 0.1–0.9)
Monocytes: 6 %
Neutrophils Absolute: 4.6 10*3/uL (ref 1.4–7.0)
Neutrophils: 71 %
Platelets: 215 10*3/uL (ref 150–450)
RBC: 4.56 x10E6/uL (ref 3.77–5.28)
RDW: 13.4 % (ref 11.7–15.4)
WBC: 6.4 10*3/uL (ref 3.4–10.8)

## 2023-10-31 LAB — LIPID PANEL
Cholesterol, Total: 183 mg/dL (ref 100–199)
HDL: 68 mg/dL (ref >39)
LDL CALC COMMENT:: 2.7 ratio (ref 0.0–4.4)
LDL Chol Calc (NIH): 90 mg/dL (ref 0–99)
Triglycerides: 146 mg/dL (ref 0–149)
VLDL Cholesterol Cal: 25 mg/dL (ref 5–40)

## 2023-10-31 LAB — CMP14+EGFR
ALT: 19 IU/L (ref 0–32)
AST: 25 IU/L (ref 0–40)
Albumin: 4.6 g/dL (ref 3.7–4.7)
Alkaline Phosphatase: 86 IU/L (ref 44–121)
BUN/Creatinine Ratio: 16 (ref 12–28)
BUN: 14 mg/dL (ref 8–27)
Bilirubin Total: 0.3 mg/dL (ref 0.0–1.2)
CO2: 24 mmol/L (ref 20–29)
Calcium: 9.6 mg/dL (ref 8.7–10.3)
Chloride: 102 mmol/L (ref 96–106)
Creatinine, Ser: 0.88 mg/dL (ref 0.57–1.00)
Globulin, Total: 2.6 g/dL (ref 1.5–4.5)
Glucose: 113 mg/dL — ABNORMAL HIGH (ref 70–99)
Potassium: 4.8 mmol/L (ref 3.5–5.2)
Sodium: 142 mmol/L (ref 134–144)
Total Protein: 7.2 g/dL (ref 6.0–8.5)
eGFR: 65 mL/min/{1.73_m2} (ref >59)

## 2023-11-03 LAB — HGB A1C W/O EAG: Hgb A1c MFr Bld: 5.8 % — ABNORMAL HIGH (ref 4.8–5.6)

## 2023-11-03 LAB — SPECIMEN STATUS REPORT

## 2023-11-06 ENCOUNTER — Other Ambulatory Visit: Payer: Self-pay | Admitting: Family

## 2023-11-06 DIAGNOSIS — R7303 Prediabetes: Secondary | ICD-10-CM | POA: Insufficient documentation

## 2023-11-09 ENCOUNTER — Ambulatory Visit: Payer: Medicare HMO

## 2023-11-09 VITALS — BP 154/73 | HR 76 | Ht 63.0 in | Wt 165.0 lb

## 2023-11-09 DIAGNOSIS — M7989 Other specified soft tissue disorders: Secondary | ICD-10-CM | POA: Diagnosis not present

## 2023-11-09 NOTE — Progress Notes (Signed)
 BP (!) 154/73   Pulse 76   Ht 5\' 3"  (1.6 m)   Wt 74.8 kg   SpO2 99%   BMI 29.23 kg/m    Subjective:   Patient ID: Frances Morrison, female    DOB: 1941/05/02, 83 y.o.   MRN: [id number]  HPI: Frances Morrison is a 83 y.o. female presenting on 11/09/2023 for nodule (Right elbow. Present for 39m. Some pain with bending.)   HPI She is having increased soreness with a nodule on her right elbow that started 2-3 weeks ago. She says the nodule has been there for 2 months. Soreness is aggravated by movement and lifting. The sensation can radiate up to her shoulder at times. She has tried heat on the area with no improvement but has not tried any medicines. Reports it is not too painful, just sore and a pulling sensation. Denies tingling, numbness, or trauma. Denies fever or chills.  Relevant past medical, surgical, family and social history reviewed and updated as indicated. Interim medical history since our last visit reviewed. Allergies and medications reviewed and updated.  Review of Systems  Constitutional:  Negative for chills and fever.  Respiratory:  Negative for shortness of breath.   Cardiovascular:  Negative for chest pain.  Musculoskeletal:  Positive for myalgias. Negative for arthralgias.  Skin:  Negative for rash.       Right elbow nodule  Psychiatric/Behavioral:  Negative for agitation.   All other systems reviewed and are negative.   Per HPI unless specifically indicated above   Allergies as of 11/09/2023       Reactions   Codeine Hives   Latex Other (See Comments)   Statins    Muscle  pain   Sulfa Antibiotics    Eye cream caused swelling and irritated.   Betadine [povidone Iodine] Rash        Medication List        Accurate as of November 09, 2023  9:20 AM. If you have any questions, ask your nurse or doctor.          alendronate 70 MG tablet Commonly known as: FOSAMAX TAKE 1 TABLET WEEKLY ON AN EMPTY STOMACH WITH FULL GLASS OF WATER   amLODipine 5 MG  tablet Commonly known as: NORVASC TAKE 1 TABLET DAILY   calcium carbonate 1500 (600 Ca) MG Tabs tablet Commonly known as: OSCAL Take by mouth 2 (two) times daily with a meal.   Centrum Silver 50+Women Tabs Take by mouth daily.   esomeprazole 40 MG capsule Commonly known as: NEXIUM TAKE 1 CAPSULE DAILY BEFORE BREAKFAST   meloxicam 7.5 MG tablet Commonly known as: MOBIC TAKE 1 TABLET DAILY   TYLENOL ARTHRITIS PAIN PO Take by mouth as needed.   Vitamin D3 Maximum Strength 125 MCG (5000 UT) capsule Generic drug: Cholecalciferol Take 5,000 Units by mouth daily.         Objective:   BP (!) 154/73   Pulse 76   Ht 5\' 3"  (1.6 m)   Wt 74.8 kg   SpO2 99%   BMI 29.23 kg/m   Wt Readings from Last 3 Encounters:  11/09/23 74.8 kg  10/30/23 74.8 kg  04/28/23 74 kg    Physical Exam Vitals reviewed.  Constitutional:      General: She is not in acute distress.    Appearance: Normal appearance. She is well-developed.  Cardiovascular:     Rate and Rhythm: Normal rate.  Pulmonary:     Effort: Pulmonary effort is normal.  No respiratory distress.  Musculoskeletal:     Right elbow: Normal.     Left elbow: Normal.     Comments: Some pain with flexion, internal and external rotation.   Skin:    General: Skin is warm and dry.     Comments: Visible 3cm round, soft, mobile, tender to palpation nodule on the Left medial epicondyle of the elbow. No discharge, erythema, lesion, rash, or bruising noted.  Neurological:     Mental Status: She is alert.  Psychiatric:        Behavior: Behavior normal.       Assessment & Plan:   Problem List Items Addressed This Visit   None Visit Diagnoses       Nodule of soft tissue    -  Primary   Right medial epicondyle of the elbow   Relevant Orders   [place] RT UPPER EXTREM LTD SOFT TISSUE NON VASCULAR       She appears to have a cyst on the Right medial epicondyle of her elbow. Not present on the Left arm extremity. Discomfort felt on  flexion, external, and internal rotation. No rash, lesion, bruising, or discharge present. Will send her for an ultrasound of the soft tissue of the Right upper extremity to assess for cyst. Will follow up after ultrasound completed for further treatment if needed. Follow up plan: Return if symptoms worsen or fail to improve.  Counseling provided for all of the vaccine components Orders Placed This Encounter  Procedures   [place] RT UPPER EXTREM LTD SOFT TISSUE NON VASCULAR    Maximino Sarin PA-S [facility] 11/09/2023, 9:20 AM  I was personally present for all components of the history, physical exam and/or medical decision making.  I agree with the documentation performed by the student and agree with assessment and plan above.  Soft tissue upper extremity nodule, will do ultrasound.  Likely fatty tissue or a cyst Arville Care, MD [facility] 11/23/2023, 7:45 AM

## 2023-11-17 ENCOUNTER — Ambulatory Visit (HOSPITAL_COMMUNITY): Payer: Medicare HMO

## 2023-11-23 ENCOUNTER — Ambulatory Visit (HOSPITAL_COMMUNITY)
Admission: RE | Admit: 2023-11-23 | Discharge: 2023-11-23 | Disposition: A | Discharge status: Home / Self Care | Payer: Medicare HMO | Source: Ambulatory Visit | Attending: Family Medicine | Admitting: Family Medicine

## 2023-11-23 DIAGNOSIS — M7989 Other specified soft tissue disorders: Secondary | ICD-10-CM | POA: Diagnosis not present

## 2023-11-23 DIAGNOSIS — R2231 Localized swelling, mass and lump, right upper limb: Secondary | ICD-10-CM | POA: Diagnosis not present

## 2023-11-26 ENCOUNTER — Encounter: Payer: Self-pay | Admitting: Family Medicine

## 2024-01-07 ENCOUNTER — Ambulatory Visit: Payer: Medicare HMO

## 2024-01-07 VITALS — BP 154/73 | HR 76 | Ht 63.0 in | Wt 165.0 lb

## 2024-01-07 DIAGNOSIS — Z0000 Encounter for general adult medical examination without abnormal findings: Secondary | ICD-10-CM

## 2024-01-07 NOTE — Progress Notes (Signed)
 Subjective:   Frances Morrison is a 83 y.o. who presents for a Medicare Wellness preventive visit.  Visit Complete: Virtual I connected with  Frances Morrison on 01/07/24 by a audio enabled telemedicine application and verified that I am speaking with the correct person using two identifiers.  Patient Location: Home  Provider Location: Home Office  I discussed the limitations of evaluation and management by telemedicine. The patient expressed understanding and agreed to proceed.  Vital Signs: Because this visit was a virtual/telehealth visit, some criteria may be missing or patient reported. Any vitals not documented were not able to be obtained and vitals that have been documented are patient reported.  VideoDeclined- This patient declined Librarian, academic. Therefore the visit was completed with audio only.  Persons Participating in Visit: Patient.  AWV Questionnaire: No: Patient Medicare AWV questionnaire was not completed prior to this visit.  Cardiac Risk Factors include: advanced age (>98men, >109 women);dyslipidemia;hypertension     Objective:    Today's Vitals   01/07/24 0939  BP: (!) 154/73  Pulse: 76  Weight: 165 lb (74.8 kg)  Height: 5\' 3"  (1.6 m)   Body mass index is 29.23 kg/m.     01/07/2024    9:52 AM 01/05/2023    8:54 AM 02/02/2020    1:45 PM 08/15/2015    9:56 AM 07/25/2015    1:38 PM 07/11/2015   11:26 AM  Advanced Directives  Does Patient Have a Medical Advance Directive? Yes No No No No No  Type of Science writer of Healthcare Power of Attorney in Chart? No - copy requested       Would patient like information on creating a medical advance directive?  No - Patient declined  No - patient declined information No - patient declined information No - patient declined information    Current Medications (verified) Outpatient Encounter Medications as of 01/07/2024  Medication Sig   Acetaminophen  (TYLENOL ARTHRITIS PAIN PO) Take by mouth as needed.   alendronate (FOSAMAX) 70 MG tablet TAKE 1 TABLET WEEKLY ON AN EMPTY STOMACH WITH FULL GLASS OF WATER   amLODipine (NORVASC) 5 MG tablet TAKE 1 TABLET DAILY   calcium carbonate (OSCAL) 1500 (600 Ca) MG TABS tablet Take by mouth 2 (two) times daily with a meal.   Cholecalciferol (VITAMIN D3 MAXIMUM STRENGTH) 125 MCG (5000 UT) capsule Take 5,000 Units by mouth daily.   esomeprazole (NEXIUM) 40 MG capsule TAKE 1 CAPSULE DAILY BEFORE BREAKFAST   meloxicam (MOBIC) 7.5 MG tablet TAKE 1 TABLET DAILY   Multiple Vitamins-Minerals (CENTRUM SILVER 50+WOMEN) TABS Take by mouth daily.   No facility-administered encounter medications on file as of 01/07/2024.    Allergies (verified) Codeine, Latex, Statins, Sulfa antibiotics, and Betadine [povidone iodine]   History: Past Medical History:  Diagnosis Date   Arthritis    arthritis -feet, hands knee   Cervical cancer (HCC)    GERD (gastroesophageal reflux disease)    Hypertension    PONV (postoperative nausea and vomiting)    Past Surgical History:  Procedure Laterality Date   ABDOMINAL HYSTERECTOMY     APPENDECTOMY     CATARACT EXTRACTION, BILATERAL Bilateral    CHOLECYSTECTOMY     TONSILLECTOMY     TOTAL KNEE ARTHROPLASTY Right 07/25/2015   Procedure: TOTAL RIGHT KNEE ARTHROPLASTY;  Surgeon: Ranee Gosselin, MD;  Location: [facility];  Service: Orthopedics;  Laterality: Right;   Family History  Problem Relation Age of Onset   Cancer Mother    Cancer Father    Social History   Socioeconomic History   Marital status: Married    Spouse name: Not on file   Number of children: Not on file   Years of education: Not on file   Highest education level: Not on file  Occupational History   Not on file  Tobacco Use   Smoking status: Former    Current packs/day: 0.00    Average packs/day: 1 pack/day for 12.0 years (12.0 ttl pk-yrs)    Types: Cigarettes    Start date: 07/10/1998    Quit date:  07/10/2010    Years since quitting: 13.5   Smokeless tobacco: Never  Vaping Use   Vaping status: Never Used  Substance and Sexual Activity   Alcohol use: No   Drug use: No   Sexual activity: Not Currently  Other Topics Concern   Not on file  Social History Narrative   Not on file   Social Drivers of Health   Financial Resource Strain: Low Risk  (01/07/2024)   Overall Financial Resource Strain (CARDIA)    Difficulty of Paying Living Expenses: Not hard at all  Food Insecurity: No Food Insecurity (01/07/2024)   Hunger Vital Sign    Worried About Running Out of Food in the Last Year: Never true    Ran Out of Food in the Last Year: Never true  Transportation Needs: No Transportation Needs (01/07/2024)   PRAPARE - Administrator, Civil Service (Medical): No    Lack of Transportation (Non-Medical): No  Physical Activity: Insufficiently Active (01/07/2024)   Exercise Vital Sign    Days of Exercise per Week: 3 days    Minutes of Exercise per Session: 20 min  Stress: Patient Unable To Answer (01/07/2024)   [employer] [employer] - Occupational Stress Questionnaire    Feeling of Stress : Patient unable to answer  Social Connections: Moderately Integrated (01/07/2024)   Social Connection and Isolation Panel [NHANES]    Frequency of Communication with Friends and Family: More than three times a week    Frequency of Social Gatherings with Friends and Family: More than three times a week    Attends Religious Services: More than 4 times per year    Active Member of [employer] or Organizations: No    Attends Engineer, structural: Never    Marital Status: Married    Tobacco Counseling Counseling given: Yes    Clinical Intake:  Pre-visit preparation completed: Yes  Pain : No/denies pain     BMI - recorded: 29.23 Nutritional Status: BMI 25 -29 Overweight Nutritional Risks: None Diabetes: No  Lab Results  Component Value Date   HGBA1C 5.8 (H) 10/30/2023      How often do you need to have someone help you when you read instructions, pamphlets, or other written materials from your doctor or pharmacy?: 1 - Never  Interpreter Needed?: No  Information entered by :: Zane Herald T/CMA   Activities of Daily Living     01/07/2024    9:49 AM  In your present state of health, do you have any difficulty performing the following activities:  Hearing? 0  Vision? 0  Difficulty concentrating or making decisions? 0  Walking or climbing stairs? 0  Dressing or bathing? 0  Doing errands, shopping? 0  Preparing Food and eating ? N  Using the Toilet? N  In the past six months, have you accidently leaked  urine? N  Do you have problems with loss of bowel control? N  Managing your Medications? N  Managing your Finances? N  Housekeeping or managing your Housekeeping? N    Patient Care Team: Junie Spencer, FNP as PCP - General (Family Medicine)  Indicate any recent Medical Services you may have received from other than [facility] providers in the past year (date may be approximate).     Assessment:   This is a routine wellness examination for Burns.  Hearing/Vision screen Hearing Screening - Comments:: Pt denies hearing def  Pt do have hearing aids Vision Screening - Comments:: Pt denies vision def Pt goes to [facility] in [place]   Goals Addressed             This Visit's Progress    Patient Stated       Be able go outside and plant more flowers       Depression Screen     01/07/2024    9:46 AM 11/09/2023    8:55 AM 10/30/2023    8:22 AM 04/28/2023    8:32 AM 01/05/2023    8:53 AM 10/27/2022    8:07 AM 04/24/2022    8:50 AM  PHQ 2/9 Scores  PHQ - 2 Score 3 0 0 0 0 0 0  PHQ- 9 Score 3  0 0  0     Fall Risk     01/07/2024    9:45 AM 11/09/2023    8:55 AM 01/05/2023    8:52 AM 10/27/2022    8:07 AM 04/24/2022    8:50 AM  Fall Risk   Falls in the past year? 0 0 0 0 0  Number falls in past yr: 0  0    Injury with Fall? 0  0     Risk for fall due to : No Fall Risks  No Fall Risks    Follow up Falls prevention discussed;Falls evaluation completed  Falls prevention discussed      MEDICARE RISK AT HOME:  Medicare Risk at Home Any stairs in or around the home?: Yes If so, are there any without handrails?: No Home free of loose throw rugs in walkways, pet beds, electrical cords, etc?: Yes Adequate lighting in your home to reduce risk of falls?: Yes Life alert?: No Use of a cane, walker or w/c?: No Grab bars in the bathroom?: No Shower chair or bench in shower?: No Elevated toilet seat or a handicapped toilet?: No  TIMED UP AND GO:  Was the test performed?  No  Cognitive Function: 6CIT completed        01/07/2024    9:59 AM 01/05/2023    8:54 AM  6CIT Screen  What Year? 0 points 0 points  What month? 0 points 0 points  What time? 0 points 0 points  Count back from 20 0 points 0 points  Months in reverse 4 points 0 points  Repeat phrase 0 points 0 points  Total Score 4 points 0 points    Immunizations Immunization History  Administered Date(s) Administered   Fluad Quad(high Dose 65+) 11/04/2016, 10/26/2017, 07/14/2018, 09/27/2019, 07/18/2021, 08/13/2022   Fluad Trivalent(High Dose 65+) 07/27/2023   Influenza, High Dose Seasonal PF 11/04/2016, 10/26/2017, 07/14/2018   Influenza,inj,Quad PF,6+ Mos 11/04/2016, 10/26/2017, 07/14/2018   Influenza,trivalent, recombinat, inj, PF 09/13/2014   Influenza-Unspecified 09/13/2014, 11/04/2016, 10/26/2017, 07/14/2018, 08/13/2020   Moderna Covid-19 Vaccine Bivalent Booster 37yrs & up 08/06/2021   Moderna Sars-Covid-2 Vaccination 12/07/2019, 01/04/2020, 09/11/2020  Pneumococcal Conjugate-13 11/04/2016   Pneumococcal Polysaccharide-23 07/06/2014   Td 02/12/2005   Td (Adult),5 Lf Tetanus Toxid, Preservative Free 02/12/2005   Tdap 02/12/2005, 09/27/2019   Tetanus 02/12/2005   Zoster Recombinant(Shingrix) 10/25/2021, 04/24/2022    Screening Tests Health  Maintenance  Topic Date Due   COVID-19 Vaccine (5 - 2024-25 season) 01/22/2025 (Originally 06/07/2023)   INFLUENZA VACCINE  05/06/2024   Medicare Annual Wellness (AWV)  01/06/2025   DEXA SCAN  04/27/2025   DTaP/Tdap/Td (5 - Td or Tdap) 09/26/2029   Pneumonia Vaccine 84+ Years old  Completed   Zoster Vaccines- Shingrix  Completed   HPV VACCINES  Aged Out    Health Maintenance  There are no preventive care reminders to display for this patient.  Health Maintenance Items Addressed: See Nurse Notes  Additional Screening:  Vision Screening: Recommended annual ophthalmology exams for early detection of glaucoma and other disorders of the eye.  Dental Screening: Recommended annual dental exams for proper oral hygiene  Community Resource Referral / Chronic Care Management: CRR required this visit?  No   CCM required this visit?  No     Plan:     I have personally reviewed and noted the following in the patient's chart:   Medical and social history Use of alcohol, tobacco or illicit drugs  Current medications and supplements including opioid prescriptions. Patient is not currently taking opioid prescriptions. Functional ability and status Nutritional status Physical activity Advanced directives List of other physicians Hospitalizations, surgeries, and ER visits in previous 12 months Vitals Screenings to include cognitive, depression, and falls Referrals and appointments  In addition, I have reviewed and discussed with patient certain preventive protocols, quality metrics, and best practice recommendations. A written personalized care plan for preventive services as well as general preventive health recommendations were provided to patient.     Arta Silence, CMA   01/07/2024   After Visit Summary: (Declined) Due to this being a telephonic visit, with patients personalized plan was offered to patient but patient Declined AVS at this time   Notes: Nothing significant to  report at this time.

## 2024-01-07 NOTE — Patient Instructions (Signed)
 Frances Morrison , Thank you for taking time to come for your Medicare Wellness Visit. I appreciate your ongoing commitment to your health goals. Please review the following plan we discussed and let me know if I can assist you in the future.   Referrals/Orders/Follow-Ups/Clinician Recommendations: Keep up the excellent work on your health.   This is a list of the screening recommended for you and due dates:  Health Maintenance  Topic Date Due   COVID-19 Vaccine (5 - 2024-25 season) 01/22/2025*   Flu Shot  05/06/2024   Medicare Annual Wellness Visit  01/06/2025   DEXA scan (bone density measurement)  04/27/2025   DTaP/Tdap/Td vaccine (5 - Td or Tdap) 09/26/2029   Pneumonia Vaccine  Completed   Zoster (Shingles) Vaccine  Completed   HPV Vaccine  Aged Out  *Topic was postponed. The date shown is not the original due date.    Advanced directives: (Copy Requested) Please bring a copy of your health care power of attorney and living will to the office to be added to your chart at your convenience. You can mail to [facility] [address] [place], [place] [address] or email to [email or web address]  Next Medicare Annual Wellness Visit scheduled for next year: Yes

## 2024-01-12 ENCOUNTER — Other Ambulatory Visit: Payer: Self-pay | Admitting: Family

## 2024-01-12 DIAGNOSIS — M1990 Unspecified osteoarthritis, unspecified site: Secondary | ICD-10-CM

## 2024-01-12 DIAGNOSIS — M810 Age-related osteoporosis without current pathological fracture: Secondary | ICD-10-CM

## 2024-01-12 DIAGNOSIS — K219 Gastro-esophageal reflux disease without esophagitis: Secondary | ICD-10-CM

## 2024-01-12 DIAGNOSIS — I10 Essential (primary) hypertension: Secondary | ICD-10-CM

## 2024-01-26 ENCOUNTER — Ambulatory Visit (INDEPENDENT_AMBULATORY_CARE_PROVIDER_SITE_OTHER): Admitting: Nurse Practitioner

## 2024-01-26 ENCOUNTER — Encounter: Payer: Self-pay | Admitting: Nurse Practitioner

## 2024-01-26 VITALS — BP 145/75 | HR 74 | Temp 97.5°F | Ht 63.0 in | Wt 158.0 lb

## 2024-01-26 DIAGNOSIS — R300 Dysuria: Secondary | ICD-10-CM | POA: Diagnosis not present

## 2024-01-26 DIAGNOSIS — B3731 Acute candidiasis of vulva and vagina: Secondary | ICD-10-CM | POA: Diagnosis not present

## 2024-01-26 LAB — URINALYSIS, COMPLETE
Bilirubin, UA: NEGATIVE
Glucose, UA: NEGATIVE
Nitrite, UA: NEGATIVE
Protein,UA: NEGATIVE
RBC, UA: NEGATIVE
Specific Gravity, UA: 1.025 (ref 1.005–1.030)
Urobilinogen, Ur: 0.2 mg/dL (ref 0.2–1.0)
pH, UA: 5.5 (ref 5.0–7.5)

## 2024-01-26 LAB — MICROSCOPIC EXAMINATION
RBC, Urine: NONE SEEN /HPF (ref 0–2)
Renal Epithel, UA: NONE SEEN /HPF
Yeast, UA: NONE SEEN

## 2024-01-26 MED ORDER — FLUCONAZOLE 150 MG PO TABS
ORAL_TABLET | ORAL | 0 refills | Status: DC
Start: 2024-01-26 — End: 2024-06-24

## 2024-01-26 MED ORDER — NYSTATIN 100000 UNIT/GM EX CREA: 1.0000 | TOPICAL_CREAM | Freq: Two times a day (BID) | CUTANEOUS | 1 refills | Status: DC

## 2024-01-26 NOTE — Addendum Note (Signed)
 Addended by: Cherylyn Cos on: 01/26/2024 12:25 PM   Modules accepted: Orders

## 2024-01-26 NOTE — Progress Notes (Signed)
   Subjective:    Patient ID: Frances Morrison, female    DOB: 07-01-1941, 83 y.o.   MRN: [id number]   Chief Complaint: Burning and itching in vaginal area and bottom   HPI Reddness , urning and itching of perineal area. Started about 2 weeks ago. Used monistat OTC which gave her slight relief but did not resolve issue.  Patient Active Problem List   Diagnosis Date Noted   Prediabetes 11/06/2023   Hypertension    Arthritis    GERD (gastroesophageal reflux disease)    Osteoporosis    Vitamin D  deficiency    Dyshidrotic eczema 11/08/2018   History of total knee arthroplasty 07/25/2015   Cellulitis of right lower extremity 03/30/2015   Edema of right lower extremity 03/30/2015   Mixed hyperlipidemia 09/13/2014   Primary osteoarthritis of foot 06/20/2014       Review of Systems  Constitutional:  Negative for diaphoresis.  Eyes:  Negative for pain.  Respiratory:  Negative for shortness of breath.   Cardiovascular:  Negative for chest pain, palpitations and leg swelling.  Gastrointestinal:  Negative for abdominal pain.  Endocrine: Negative for polydipsia.  Skin:  Negative for rash.  Neurological:  Negative for dizziness, weakness and headaches.  Hematological:  Does not bruise/bleed easily.  All other systems reviewed and are negative.      Objective:   Physical Exam Constitutional:      Appearance: Normal appearance.  Cardiovascular:     Rate and Rhythm: Normal rate and regular rhythm.     Heart sounds: Normal heart sounds.  Pulmonary:     Effort: Pulmonary effort is normal.     Breath sounds: Normal breath sounds.  Genitourinary:    Comments: External gentalia is thick and white down center with erythema surrounding. Skin:    General: Skin is warm.  Neurological:     General: No focal deficit present.     Mental Status: She is alert and oriented to person, place, and time.  Psychiatric:        Mood and Affect: Mood normal.        Behavior: Behavior normal.     BP (!) 145/75   Pulse 74   Temp (!) 97.5 F (36.4 C) (Temporal)   Ht 5\' 3"  (1.6 m)   Wt 158 lb (71.7 kg)   SpO2 96%   BMI 27.99 kg/m   UA- 1+ leuks     Assessment & Plan:  Frances Morrison in today with chief complaint of Burning and itching in vaginal area and bottom   1. Dysuria (Primary) Urine culture pending - Urinalysis, Complete  2. Vaginal candidiasis Avoid bubble baths - fluconazole  (DIFLUCAN ) 150 MG tablet; 1 po q week x 4 weeks  Dispense: 4 tablet; Refill: 0 - nystatin  cream (MYCOSTATIN ); Apply 1 Application topically 2 (two) times daily.  Dispense: 60 g; Refill: 1    The above assessment and management plan was discussed with the patient. The patient verbalized understanding of and has agreed to the management plan. Patient is aware to call the clinic if symptoms persist or worsen. Patient is aware when to return to the clinic for a follow-up visit. Patient educated on when it is appropriate to go to the emergency department.   Mary-Margaret Gaylyn Keas, FNP

## 2024-01-26 NOTE — Patient Instructions (Signed)

## 2024-01-29 LAB — URINE CULTURE

## 2024-01-29 MED ORDER — AMOXICILLIN-POT CLAVULANATE 875-125 MG PO TABS: 1.0000 | ORAL_TABLET | Freq: Two times a day (BID) | ORAL | 0 refills | Status: DC

## 2024-01-29 NOTE — Addendum Note (Signed)
 Addended by: Danel Requena, MARY-MARGARET on: 01/29/2024 01:05 PM   Modules accepted: Orders

## 2024-02-01 ENCOUNTER — Telehealth: Payer: Self-pay | Admitting: Family Medicine

## 2024-02-01 NOTE — Telephone Encounter (Signed)
 Try some gold bond powder. Has to make sure area is really good and dry or will not heal. Can use hairdryer on cool setting to dry area before applying any creams or powders

## 2024-02-01 NOTE — Telephone Encounter (Signed)
 Copied from CRM [id number]. Topic: Clinical - Medication Question >> Feb 01, 2024  1:07 PM Phil Braun wrote: Reason for CRM:   Ref yeast infection   Pt came in last week and was given a cream to use for a yeast infection but it is not working at all. Could the dr please call her in something different. Please advise

## 2024-02-02 ENCOUNTER — Ambulatory Visit: Payer: Self-pay

## 2024-02-02 NOTE — Telephone Encounter (Signed)
 Copied from CRM [id number]. Topic: Clinical - Red Word Triage >> Feb 02, 2024  8:40 AM Zipporah Him wrote: Red Word that prompted transfer to Nurse Triage: Patient is having severe itching and burning from her yeast infection, she has cream but it does not help. Seems to be worsening.   Chief Complaint: vaginal itching Symptoms: external itching and burning Frequency: constant Pertinent Negatives: Patient denies fever Disposition: [] ED /[] Urgent Care (no appt availability in office) / [] Appointment(In office/virtual)/ []  [facility]/ [] Home Care/ [] Refused Recommended Disposition /[] [place] [facility]/ [x]  Follow-up with PCP Additional Notes: Pt seen on 4/22 started on Nystatin  and Diflucan  1 PO q week x 4 weeks. On 4/25 she was advised that she also had a UTI and started on Augmentin  875-125. Pt now endorsing worsening vaginal itching and burning. States that the Nystatin  cream isnt helping at all and she would like something stronger or complimentary. Pharmacy confirmed. Will route to clinic for review and follow-up. Pt aware that someone will be following up with her.  Reason for Disposition  Caller wants to use a complementary or alternative medicine  Answer Assessment - Initial Assessment Questions 1. NAME of MEDICINE: "What medicine(s) are you calling about?"     Nystatin   2. QUESTION: "What is your question?" (e.g., double dose of medicine, side effect)     Severe itching and burning  3. PRESCRIBER: "Who prescribed the medicine?" Reason: if prescribed by specialist, call should be referred to that group.     Mary-Margaret  4. SYMPTOMS: "Do you have any symptoms?" If Yes, ask: "What symptoms are you having?"  "How bad are the symptoms (e.g., mild, moderate, severe)     Itching and burning  5. PREGNANCY:  "Is there any chance that you are pregnant?" "When was your last menstrual period?"     No  Protocols used: Medication Question Call-A-AH

## 2024-02-02 NOTE — Telephone Encounter (Signed)
 Left message on answering machine to contact the office

## 2024-02-02 NOTE — Telephone Encounter (Signed)
 Duplicate. LS

## 2024-02-03 NOTE — Telephone Encounter (Signed)
 Looks like the treatment that she had was plenty strong including both the pill and yeast cream.  She was also given an antibiotic, likely needs to be seen to be retested if still having symptoms.  May have developed something different like bacterial vaginosis or something else.  Please make her an appointment

## 2024-02-03 NOTE — Telephone Encounter (Signed)
Patient aware and verbalized understanding. Appt made 

## 2024-02-03 NOTE — Telephone Encounter (Signed)
  Chief Complaint: Vaginal itching continues even with "the cream." Symptoms: Itching Frequency: 4 weeks Pertinent Negatives: Patient denies  Disposition: [] ED /[] Urgent Care (no appt availability in office) / [] Appointment(In office/virtual)/ []  [facility]/ [] Home Care/ [] Refused Recommended Disposition /[] [place] [facility]/ [x]  Follow-up with PCP Additional Notes: Please advise pt.

## 2024-02-04 ENCOUNTER — Ambulatory Visit: Admitting: Nurse Practitioner

## 2024-02-04 ENCOUNTER — Encounter: Payer: Self-pay | Admitting: Nurse Practitioner

## 2024-02-04 VITALS — BP 160/69 | HR 63 | Temp 97.8°F | Ht 63.0 in | Wt 156.0 lb

## 2024-02-04 DIAGNOSIS — N904 Leukoplakia of vulva: Secondary | ICD-10-CM

## 2024-02-04 MED ORDER — CLOBETASOL PROPIONATE 0.05 % EX CREA: 1.0000 | TOPICAL_CREAM | Freq: Two times a day (BID) | CUTANEOUS | 0 refills | Status: DC

## 2024-02-04 NOTE — Telephone Encounter (Signed)
 Appt today

## 2024-02-04 NOTE — Progress Notes (Signed)
   Subjective:    Patient ID: Frances Morrison, female    DOB: 1940-12-03, 83 y.o.   MRN: [id number]   Chief Complaint: Vaginal irritation (Itching and burning. Taking the diflucan  but stopped using the cream)   HPI Patient c/o itching and b urning of perineal area. Has been going on for almost 2 weeks. Has been treated with diflucan  and cream . She stopped the cream because was not doing her any good.   Patient Active Problem List   Diagnosis Date Noted   Prediabetes 11/06/2023   Hypertension    Arthritis    GERD (gastroesophageal reflux disease)    Osteoporosis    Vitamin D  deficiency    Dyshidrotic eczema 11/08/2018   History of total knee arthroplasty 07/25/2015   Cellulitis of right lower extremity 03/30/2015   Edema of right lower extremity 03/30/2015   Mixed hyperlipidemia 09/13/2014   Primary osteoarthritis of foot 06/20/2014       Review of Systems     Objective:   Physical Exam Constitutional:      Appearance: Normal appearance. She is obese.  Genitourinary:    Vagina: No vaginal discharge.     Rectum: Normal.     Comments: Vulva and perineal thickened white skin Skin:    General: Skin is warm.  Neurological:     General: No focal deficit present.     Mental Status: She is alert and oriented to person, place, and time.  Psychiatric:        Mood and Affect: Mood normal.        Behavior: Behavior normal.    BP (!) 160/69   Pulse 63   Temp 97.8 F (36.6 C) (Temporal)   Ht 5\' 3"  (1.6 m)   Wt 156 lb (70.8 kg)   SpO2 97%   BMI 27.63 kg/m         Frances Morrison in today with chief complaint of Vaginal irritation (Itching and burning. Taking the diflucan  but stopped using the cream)   1. Lichen sclerosus of vulva (Primary) Keep area clean and dry Wear cotton panties Avoid scratching RTO prn - clobetasol  cream (TEMOVATE ) 0.05 %; Apply 1 Application topically 2 (two) times daily.  Dispense: 30 g; Refill: 0    The above assessment and management  plan was discussed with the patient. The patient verbalized understanding of and has agreed to the management plan. Patient is aware to call the clinic if symptoms persist or worsen. Patient is aware when to return to the clinic for a follow-up visit. Patient educated on when it is appropriate to go to the emergency department.   Mary-Margaret Gaylyn Keas, FNP

## 2024-02-04 NOTE — Patient Instructions (Signed)
 Itchy, Painful, White Skin Patches (Lichen Sclerosus): What to Know Lichen sclerosus is a skin problem. It can happen on any part of your body. It happens most often in the areas around your genitals or the opening of your butt (anus). Treatment can help to control symptoms. It can also help prevent scarring that may lead to other problems. This skin problem isn't an infection or a fungus. It can't be passed from person to person. What are the causes? The cause of this condition isn't known. It may be related to: The body's defense system, also called the immune system, reacting too strongly. A lack of certain hormones. What increases the risk? Being a female who has stopped having periods. This is called menopause. Being a female who wasn't circumcised. Being a child who hasn't reached puberty yet. What are the signs or symptoms?  Thin, wrinkled, white areas on the skin. Thickened white areas on the skin. Red and swollen patches on the skin. Tears or cracks in the skin. Bruising. Blood blisters. Very bad itching. Pain, itching, or burning when peeing. Trouble pooping, especially in children. How is this treated? This condition may be treated with: Topical steroids. These are creams or ointments that are put on the skin. Medicines that are taken by mouth. Topical immunotherapy. These are creams and ointments that you put on the skin to give your body's defense system a boost. Surgery. This may need to be done if there are problems like scarring. Follow these instructions at home: Medicines Take or apply your medicines only as told. Use creams or ointments as told by your doctor. Skin care Do not scratch the affected areas. Keep the affected areas clean and dry. Clean the affected area gently with water only. Avoid using rough towels or toilet paper. Avoid products that irritate the skin. These include scented soaps, lotions, and bubble bath. Use thick creams or ointments to lessen  itching as told. General instructions Your condition may cause trouble pooping. To help prevent or treat this, you may need to: Take medicines to help you poop. Eat foods high in fiber, like beans, whole grains, and fresh fruits and vegetables. Drink more fluids as told. Keep all follow-up visits. This helps make sure the treatment plan is working. Contact a doctor if: You have more redness, swelling, or pain. You have fluid, blood, or pus coming from the area. You have new patches on your skin. You have a fever. You have pain during sex. This information is not intended to replace advice given to you by your health care provider. Make sure you discuss any questions you have with your health care provider. Document Revised: 04/28/2023 Document Reviewed: 04/28/2023 Elsevier Patient Education  2024 [employer].

## 2024-02-15 ENCOUNTER — Encounter (HOSPITAL_COMMUNITY): Payer: Self-pay

## 2024-02-22 ENCOUNTER — Ambulatory Visit: Payer: Medicare HMO | Admitting: Family

## 2024-02-22 ENCOUNTER — Encounter: Payer: Self-pay | Admitting: Family

## 2024-02-22 ENCOUNTER — Telehealth: Payer: Self-pay

## 2024-02-22 VITALS — BP 131/70 | HR 81 | Temp 97.3°F | Ht 63.0 in | Wt 158.0 lb

## 2024-02-22 DIAGNOSIS — M150 Primary generalized (osteo)arthritis: Secondary | ICD-10-CM | POA: Diagnosis not present

## 2024-02-22 DIAGNOSIS — E559 Vitamin D deficiency, unspecified: Secondary | ICD-10-CM | POA: Diagnosis not present

## 2024-02-22 DIAGNOSIS — L249 Irritant contact dermatitis, unspecified cause: Secondary | ICD-10-CM | POA: Diagnosis not present

## 2024-02-22 DIAGNOSIS — R7303 Prediabetes: Secondary | ICD-10-CM

## 2024-02-22 DIAGNOSIS — E782 Mixed hyperlipidemia: Secondary | ICD-10-CM

## 2024-02-22 DIAGNOSIS — M810 Age-related osteoporosis without current pathological fracture: Secondary | ICD-10-CM | POA: Diagnosis not present

## 2024-02-22 DIAGNOSIS — K219 Gastro-esophageal reflux disease without esophagitis: Secondary | ICD-10-CM | POA: Diagnosis not present

## 2024-02-22 DIAGNOSIS — I10 Essential (primary) hypertension: Secondary | ICD-10-CM | POA: Diagnosis not present

## 2024-02-22 LAB — CMP14+EGFR
ALT: 20 IU/L (ref 0–32)
AST: 25 IU/L (ref 0–40)
Albumin: 4.3 g/dL (ref 3.7–4.7)
Alkaline Phosphatase: 89 IU/L (ref 44–121)
BUN/Creatinine Ratio: 25 (ref 12–28)
BUN: 23 mg/dL (ref 8–27)
Bilirubin Total: 0.2 mg/dL (ref 0.0–1.2)
CO2: 24 mmol/L (ref 20–29)
Calcium: 9.9 mg/dL (ref 8.7–10.3)
Chloride: 101 mmol/L (ref 96–106)
Creatinine, Ser: 0.92 mg/dL (ref 0.57–1.00)
Globulin, Total: 2.3 g/dL (ref 1.5–4.5)
Glucose: 100 mg/dL — ABNORMAL HIGH (ref 70–99)
Potassium: 5.1 mmol/L (ref 3.5–5.2)
Sodium: 143 mmol/L (ref 134–144)
Total Protein: 6.6 g/dL (ref 6.0–8.5)
eGFR: 61 mL/min/{1.73_m2} (ref >59)

## 2024-02-22 MED ORDER — CLOBETASOL PROPIONATE 0.05 % EX CREA: 1.0000 | TOPICAL_CREAM | Freq: Two times a day (BID) | CUTANEOUS | 1 refills | Status: DC

## 2024-02-22 MED ORDER — PREDNISONE 10 MG (21) PO TBPK: ORAL_TABLET | ORAL | 0 refills | Status: DC

## 2024-02-22 NOTE — Patient Instructions (Signed)
 Contact Dermatitis Dermatitis is redness, soreness, and swelling (inflammation) of the skin. Contact dermatitis is a reaction to certain substances that touch the skin. There are two types of this condition: Irritant contact dermatitis. This is the most common type. It happens when something irritates your skin, such as when your hands get dry from washing them too often with soap. You can get this type of reaction even if you have not been exposed to the irritant before. Allergic contact dermatitis. This type is caused by a substance that you are allergic to, such as poison ivy. It occurs when you have been exposed to the substance (allergen) and form a sensitivity to it. In some cases, the reaction may start soon after your first exposure to the allergen. In other cases, it may not start until you are exposed to the allergen again. It may then occur every time you are exposed to the allergen in the future. What are the causes? Irritant contact dermatitis is often caused by exposure to: Makeup. Soaps, detergents, and bleaches. Acids. Metal salts, such as nickel. Allergic contact dermatitis is often caused by exposure to: Poisonous plants. Chemicals. Jewelry. Latex. Medicines. Preservatives in products, such as clothes. What increases the risk? You are more likely to get this condition if you have: A job that exposes you to irritants or allergens. Certain medical conditions. These include asthma and eczema. What are the signs or symptoms? Symptoms of this condition may occur in any place on your body that has been touched by the irritant. Symptoms include: Dryness, flaking, or cracking. Redness. Itching. Pain or a burning feeling. Blisters. Drainage of small amounts of blood or clear fluid from skin cracks. With allergic contact dermatitis, there may also be swelling in areas such as the eyelids, mouth, or genitals. How is this diagnosed? This condition is diagnosed with a medical  history and physical exam. A patch skin test may be done to help figure out the cause. If the condition is related to your job, you may need to see an expert in health problems in the workplace (occupational medicine specialist). How is this treated? This condition is treated by staying away from the cause of the reaction and protecting your skin from further contact. Treatment may also include: Steroid creams or ointments. Steroid medicines may need be taken by mouth (orally) in more severe cases. Antibiotics or medicines applied to the skin to kill bacteria (antibacterial ointments). These may be needed if a skin infection is present. Antihistamines. These may be taken orally or put on as a lotion to ease itching. A bandage (dressing). Follow these instructions at home: Skin care Moisturize your skin as needed. Put cool, wet cloths (cool compresses) on the affected areas. Try applying baking soda paste to your skin. Stir water into baking soda until it has the consistency of a paste. Do not scratch your skin. Avoid friction to the affected area. Avoid the use of soaps, perfumes, and dyes. Check the affected areas every day for signs of infection. Check for: More redness, swelling, or pain. More fluid or blood. Warmth. Pus or a bad smell. Medicines Take or apply over-the-counter and prescription medicines only as told by your health care provider. If you were prescribed antibiotics, take or apply them as told by your health care provider. Do not stop using the antibiotic even if you start to feel better. Bathing Try taking a bath with: Epsom salts. Follow the instructions on the packaging. You can get these at your local pharmacy  or grocery store. Baking soda. Pour a small amount into the bath as told by your health care provider. Colloidal oatmeal. Follow the instructions on the packaging. You can get this at your local pharmacy or grocery store. Bathe less often. This may mean bathing  every other day. Bathe in lukewarm water. Avoid using hot water. Bandage care If you were given a dressing, change it as told by your health care provider. Wash your hands with soap and water for at least 20 seconds before and after you change your dressing. If soap and water are not available, use hand sanitizer. General instructions Avoid the substance that caused your reaction. If you do not know what caused it, keep a journal to try to track what caused it. Write down: What you eat and drink. What cosmetics you use. What you wear in the affected area. This includes jewelry. Contact a health care provider if: Your condition does not get better with treatment. Your condition gets worse. You have any signs of infection. You have a fever. You have new symptoms. Your bone or joint under the affected area becomes painful after the skin has healed. Get help right away if: You notice red streaks coming from the affected area. The affected area turns darker. You have trouble breathing. This information is not intended to replace advice given to you by your health care provider. Make sure you discuss any questions you have with your health care provider. Document Revised: 03/28/2022 Document Reviewed: 03/28/2022 Elsevier Patient Education  2024 [employer].

## 2024-02-22 NOTE — Telephone Encounter (Signed)
 Copied from CRM [id number]. Topic: General - Other >> Feb 22, 2024  1:46 PM Dominique A wrote: Reason for CRM: Patient was seen in the office today by her PCP and was told to take he bp when she gets home and call the office back with her readings.  BP 131/70 Pulse 71

## 2024-02-22 NOTE — Progress Notes (Signed)
 Subjective:    Patient ID: Frances Morrison, female    DOB: 08/29/1941, 83 y.o.   MRN: [id number]  Chief Complaint  Patient presents with   Follow-up    Discuss repeat u/s   Pt presents to the office today for chronic follow up.   She has osteoporosis and takes Fosamax  weekly. States she does not do any weight bearing exercises, except cleaning her home. She takes calcium  and vit d daily. Last Dexascan was 04/28/23.   Her BP is elevated today, but pT has not taken her BP medications today. Hypertension This is a chronic problem. The current episode started more than 1 year ago. The problem has been waxing and waning since onset. The problem is uncontrolled. Pertinent negatives include no blurred vision, malaise/fatigue, peripheral edema or shortness of breath. Risk factors for coronary artery disease include dyslipidemia, obesity and sedentary lifestyle. The current treatment provides moderate improvement.  Gastroesophageal Reflux She complains of belching and heartburn. This is a chronic problem. The current episode started more than 1 year ago. The problem occurs occasionally. Risk factors include obesity. She has tried a PPI for the symptoms. The treatment provided moderate relief.  Arthritis Presents for follow-up visit. She complains of pain and stiffness. Affected locations include the left MCP, right MCP, left foot, right foot and right elbow. Her pain is at a severity of 2/10. Associated symptoms include rash.  Hyperlipidemia This is a chronic problem. The current episode started more than 1 year ago. The problem is controlled. Recent lipid tests were reviewed and are normal. Exacerbating diseases include obesity. Pertinent negatives include no shortness of breath. Current antihyperlipidemic treatment includes diet change. The current treatment provides mild improvement of lipids. Risk factors for coronary artery disease include dyslipidemia, hypertension, a sedentary lifestyle and  post-menopausal.  Rash This is a new problem. The current episode started 1 to 4 weeks ago. The problem has been waxing and waning since onset. Location: back. The rash is characterized by itchiness and redness. Pertinent negatives include no shortness of breath. Past treatments include anti-itch cream. The treatment provided mild relief.  Diabetes She presents for her follow-up diabetic visit. Diabetes type: prediabetes. Pertinent negatives for diabetes include no blurred vision and no foot paresthesias. Symptoms are stable.      Review of Systems  Constitutional:  Negative for malaise/fatigue.  Eyes:  Negative for blurred vision.  Respiratory:  Negative for shortness of breath.   Gastrointestinal:  Positive for heartburn.  Musculoskeletal:  Positive for stiffness.  Skin:  Positive for rash.  All other systems reviewed and are negative.      Objective:   Physical Exam Vitals reviewed.  Constitutional:      General: She is not in acute distress.    Appearance: She is well-developed. She is obese.  HENT:     Head: Normocephalic and atraumatic.     Right Ear: Tympanic membrane normal.     Left Ear: Tympanic membrane normal.  Eyes:     Pupils: Pupils are equal, round, and reactive to light.  Neck:     Thyroid: No thyromegaly.  Cardiovascular:     Rate and Rhythm: Normal rate and regular rhythm.     Heart sounds: Normal heart sounds. No murmur heard. Pulmonary:     Effort: Pulmonary effort is normal. No respiratory distress.     Breath sounds: Normal breath sounds. No wheezing.  Abdominal:     General: Bowel sounds are normal. There is no distension.  Palpations: Abdomen is soft.     Tenderness: There is no abdominal tenderness.  Musculoskeletal:        General: No tenderness. Normal range of motion.     Cervical back: Normal range of motion and neck supple.       Feet:  Feet:     Comments: Swollen area, no erythemas, heat or pain noted Skin:    General: Skin is  warm and dry.     Findings: Rash present. Rash is urticarial.          Comments: Erythemas urticarial rash on lower back  Neurological:     Mental Status: She is alert and oriented to person, place, and time.     Cranial Nerves: No cranial nerve deficit.     Deep Tendon Reflexes: Reflexes are normal and symmetric.  Psychiatric:        Behavior: Behavior normal.        Thought Content: Thought content normal.        Judgment: Judgment normal.      BP (!) 168/73   Pulse 81   Temp (!) 97.3 F (36.3 C) (Temporal)   Ht 5\' 3"  (1.6 m)   Wt 158 lb (71.7 kg)   BMI 27.99 kg/m      Assessment & Plan:  Frances Morrison comes in today with chief complaint of Follow-up (Discuss repeat u/s)   Diagnosis and orders addressed:  1. Primary hypertension (Primary) - CMP14+EGFR  2. Gastroesophageal reflux disease, unspecified whether esophagitis present - CMP14+EGFR  3. Mixed hyperlipidemia - CMP14+EGFR  4. Osteoporosis, unspecified osteoporosis type, unspecified pathological fracture presence - CMP14+EGFR  5. Prediabetes - CMP14+EGFR  6. Vitamin D  deficiency - CMP14+EGFR  7. Primary osteoarthritis involving multiple joints - CMP14+EGFR  9. Irritant contact dermatitis, unspecified trigger Keep clean and dry Avoid scratching  Cool compresses  - CMP14+EGFR - predniSONE  (STERAPRED UNI-PAK 21 TAB) 10 MG (21) TBPK tablet; Use as directed  Dispense: 21 tablet; Refill: 0 - clobetasol  cream (TEMOVATE ) 0.05 %; Apply 1 Application topically 2 (two) times daily.  Dispense: 60 g; Refill: 1  Labs pending Continue all medications  Health Maintenance reviewed Diet and exercise encouraged  Follow up plan: 4 months   Tommas Fragmin, FNP

## 2024-02-23 ENCOUNTER — Ambulatory Visit: Payer: Self-pay | Admitting: Family

## 2024-02-23 NOTE — Telephone Encounter (Signed)
 Great. Continue current medications.

## 2024-03-14 DIAGNOSIS — L570 Actinic keratosis: Secondary | ICD-10-CM | POA: Diagnosis not present

## 2024-04-07 ENCOUNTER — Other Ambulatory Visit: Payer: Self-pay | Admitting: Family

## 2024-04-07 DIAGNOSIS — I10 Essential (primary) hypertension: Secondary | ICD-10-CM

## 2024-04-07 DIAGNOSIS — M810 Age-related osteoporosis without current pathological fracture: Secondary | ICD-10-CM

## 2024-04-07 DIAGNOSIS — M1990 Unspecified osteoarthritis, unspecified site: Secondary | ICD-10-CM

## 2024-04-07 DIAGNOSIS — K219 Gastro-esophageal reflux disease without esophagitis: Secondary | ICD-10-CM

## 2024-05-10 ENCOUNTER — Ambulatory Visit: Payer: Medicare HMO | Admitting: Family

## 2024-05-10 DIAGNOSIS — M79672 Pain in left foot: Secondary | ICD-10-CM | POA: Diagnosis not present

## 2024-05-10 DIAGNOSIS — M19072 Primary osteoarthritis, left ankle and foot: Secondary | ICD-10-CM | POA: Diagnosis not present

## 2024-05-10 DIAGNOSIS — M25572 Pain in left ankle and joints of left foot: Secondary | ICD-10-CM | POA: Diagnosis not present

## 2024-06-15 DIAGNOSIS — D485 Neoplasm of uncertain behavior of skin: Secondary | ICD-10-CM | POA: Diagnosis not present

## 2024-06-15 DIAGNOSIS — L280 Lichen simplex chronicus: Secondary | ICD-10-CM | POA: Diagnosis not present

## 2024-06-15 DIAGNOSIS — L309 Dermatitis, unspecified: Secondary | ICD-10-CM | POA: Diagnosis not present

## 2024-06-21 DIAGNOSIS — M79672 Pain in left foot: Secondary | ICD-10-CM | POA: Diagnosis not present

## 2024-06-21 DIAGNOSIS — M19072 Primary osteoarthritis, left ankle and foot: Secondary | ICD-10-CM | POA: Diagnosis not present

## 2024-06-21 DIAGNOSIS — M25572 Pain in left ankle and joints of left foot: Secondary | ICD-10-CM | POA: Diagnosis not present

## 2024-06-24 ENCOUNTER — Ambulatory Visit: Payer: Self-pay | Admitting: Family

## 2024-06-24 ENCOUNTER — Encounter: Payer: Self-pay | Admitting: Family

## 2024-06-24 ENCOUNTER — Ambulatory Visit: Admitting: Family

## 2024-06-24 VITALS — BP 145/69 | HR 85 | Temp 98.0°F | Ht 63.0 in | Wt 162.4 lb

## 2024-06-24 DIAGNOSIS — E559 Vitamin D deficiency, unspecified: Secondary | ICD-10-CM | POA: Diagnosis not present

## 2024-06-24 DIAGNOSIS — M810 Age-related osteoporosis without current pathological fracture: Secondary | ICD-10-CM | POA: Diagnosis not present

## 2024-06-24 DIAGNOSIS — D692 Other nonthrombocytopenic purpura: Secondary | ICD-10-CM

## 2024-06-24 DIAGNOSIS — K219 Gastro-esophageal reflux disease without esophagitis: Secondary | ICD-10-CM

## 2024-06-24 DIAGNOSIS — N898 Other specified noninflammatory disorders of vagina: Secondary | ICD-10-CM

## 2024-06-24 DIAGNOSIS — E782 Mixed hyperlipidemia: Secondary | ICD-10-CM

## 2024-06-24 DIAGNOSIS — R7303 Prediabetes: Secondary | ICD-10-CM | POA: Diagnosis not present

## 2024-06-24 DIAGNOSIS — M150 Primary generalized (osteo)arthritis: Secondary | ICD-10-CM

## 2024-06-24 DIAGNOSIS — B3731 Acute candidiasis of vulva and vagina: Secondary | ICD-10-CM | POA: Diagnosis not present

## 2024-06-24 DIAGNOSIS — I10 Essential (primary) hypertension: Secondary | ICD-10-CM

## 2024-06-24 DIAGNOSIS — M1990 Unspecified osteoarthritis, unspecified site: Secondary | ICD-10-CM | POA: Diagnosis not present

## 2024-06-24 LAB — WET PREP FOR TRICH, YEAST, CLUE
Clue Cell Exam: NEGATIVE
Trichomonas Exam: NEGATIVE
Yeast Exam: POSITIVE — AB

## 2024-06-24 LAB — BAYER DCA HB A1C WAIVED: HB A1C (BAYER DCA - WAIVED): 5.3 % (ref 4.8–5.6)

## 2024-06-24 MED ORDER — MELOXICAM 7.5 MG PO TABS: 7.5000 mg | ORAL_TABLET | Freq: Every day | ORAL | 0 refills | Status: DC

## 2024-06-24 MED ORDER — ALENDRONATE SODIUM 70 MG PO TABS: ORAL_TABLET | ORAL | 0 refills | Status: DC

## 2024-06-24 MED ORDER — ESOMEPRAZOLE MAGNESIUM 40 MG PO CPDR: 40.0000 mg | DELAYED_RELEASE_CAPSULE | Freq: Every day | ORAL | 1 refills | Status: AC

## 2024-06-24 MED ORDER — AMLODIPINE BESYLATE 5 MG PO TABS: 5.0000 mg | ORAL_TABLET | Freq: Every day | ORAL | 0 refills | Status: DC

## 2024-06-24 MED ORDER — FLUCONAZOLE 150 MG PO TABS: ORAL_TABLET | ORAL | 0 refills | Status: DC

## 2024-06-24 NOTE — Progress Notes (Signed)
 Subjective:    Patient ID: Frances Morrison, female    DOB: Sep 25, 1941, 83 y.o.   MRN: [id number]  Chief Complaint  Patient presents with   4 MONTH FOLLOW UP   Pt presents to the office today for chronic follow up.   She has osteoporosis and takes Fosamax  weekly. States she does not do any weight bearing exercises, except cleaning her home. She takes calcium  and vit d daily. Last Dexascan was 04/28/23.   She is followed by Dermatologists  for actinic keratosis.   She is seeing podiatry and diagnosed with hairline fracture of her left food. Doing better with this.   Her BP is elevated today. Hypertension This is a chronic problem. The current episode started more than 1 year ago. The problem has been waxing and waning since onset. The problem is uncontrolled. Pertinent negatives include no blurred vision, headaches, malaise/fatigue or peripheral edema. Risk factors for coronary artery disease include dyslipidemia, obesity and sedentary lifestyle. The current treatment provides moderate improvement.  Gastroesophageal Reflux She complains of belching and heartburn. This is a chronic problem. The current episode started more than 1 year ago. The problem occurs occasionally. The symptoms are aggravated by certain foods. Risk factors include obesity. She has tried a PPI for the symptoms. The treatment provided moderate relief.  Arthritis Presents for follow-up visit. She complains of pain and stiffness. Affected locations include the left MCP, right MCP, left foot, right foot and right elbow. Her pain is at a severity of 5/10. Associated symptoms include dysuria.  Hyperlipidemia This is a chronic problem. The current episode started more than 1 year ago. The problem is controlled. Recent lipid tests were reviewed and are normal. Exacerbating diseases include obesity. Current antihyperlipidemic treatment includes diet change. The current treatment provides mild improvement of lipids. Risk factors for  coronary artery disease include dyslipidemia, hypertension, a sedentary lifestyle and post-menopausal.  Diabetes She presents for her follow-up diabetic visit. Diabetes type: prediabetes. Pertinent negatives for hypoglycemia include no headaches. Pertinent negatives for diabetes include no blurred vision and no foot paresthesias. Symptoms are stable. She is following a generally healthy diet. (Does not check glucose at home)  Vaginal Itching The patient's primary symptoms include genital itching. The patient's pertinent negatives include no genital odor or vaginal discharge. This is a new problem. The current episode started yesterday. The problem occurs intermittently. Associated symptoms include dysuria. Pertinent negatives include no back pain, chills, frequency or headaches. She has tried nothing for the symptoms. The treatment provided no relief.      Review of Systems  Constitutional:  Negative for chills and malaise/fatigue.  Eyes:  Negative for blurred vision.  Gastrointestinal:  Positive for heartburn.  Genitourinary:  Positive for dysuria. Negative for frequency and vaginal discharge.  Musculoskeletal:  Positive for stiffness. Negative for back pain.  Neurological:  Negative for headaches.  All other systems reviewed and are negative.  Family History  Problem Relation Age of Onset   Cancer Mother    Cancer Father     Social History   Socioeconomic History   Marital status: Married    Spouse name: Not on file   Number of children: Not on file   Years of education: Not on file   Highest education level: Not on file  Occupational History   Not on file  Tobacco Use   Smoking status: Former    Current packs/day: 0.00    Average packs/day: 1 pack/day for 12.0 years (12.0 ttl pk-yrs)  Types: Cigarettes    Start date: 07/10/1998    Quit date: 07/10/2010    Years since quitting: 13.9   Smokeless tobacco: Never  Vaping Use   Vaping status: Never Used  Substance and Sexual  Activity   Alcohol use: No   Drug use: No   Sexual activity: Not Currently  Other Topics Concern   Not on file  Social History Narrative   Not on file   Social Drivers of Health   Financial Resource Strain: Low Risk  (01/07/2024)   Overall Financial Resource Strain (CARDIA)    Difficulty of Paying Living Expenses: Not hard at all  Food Insecurity: No Food Insecurity (01/07/2024)   Hunger Vital Sign    Worried About Running Out of Food in the Last Year: Never true    Ran Out of Food in the Last Year: Never true  Transportation Needs: No Transportation Needs (01/07/2024)   PRAPARE - Administrator, Civil Service (Medical): No    Lack of Transportation (Non-Medical): No  Physical Activity: Insufficiently Active (01/07/2024)   Exercise Vital Sign    Days of Exercise per Week: 3 days    Minutes of Exercise per Session: 20 min  Stress: Patient Unable To Answer (01/07/2024)   [employer] [employer] - Occupational Stress Questionnaire    Feeling of Stress : Patient unable to answer  Social Connections: Moderately Integrated (01/07/2024)   Social Connection and Isolation Panel    Frequency of Communication with Friends and Family: More than three times a week    Frequency of Social Gatherings with Friends and Family: More than three times a week    Attends Religious Services: More than 4 times per year    Active Member of [employer] or Organizations: No    Attends Banker Meetings: Never    Marital Status: Married       Objective:   Physical Exam Vitals reviewed.  Constitutional:      General: She is not in acute distress.    Appearance: She is well-developed. She is obese.  HENT:     Head: Normocephalic and atraumatic.     Right Ear: Tympanic membrane normal.     Left Ear: Tympanic membrane normal.  Eyes:     Pupils: Pupils are equal, round, and reactive to light.  Neck:     Thyroid: No thyromegaly.  Cardiovascular:     Rate and Rhythm: Normal  rate and regular rhythm.     Heart sounds: Normal heart sounds. No murmur heard. Pulmonary:     Effort: Pulmonary effort is normal. No respiratory distress.     Breath sounds: Normal breath sounds. No wheezing.  Abdominal:     General: Bowel sounds are normal. There is no distension.     Palpations: Abdomen is soft.     Tenderness: There is no abdominal tenderness.  Musculoskeletal:        General: No tenderness. Normal range of motion.     Cervical back: Normal range of motion and neck supple.       Feet:  Feet:     Comments: Swollen area, no erythemas, heat or pain noted Skin:    General: Skin is warm and dry.     Findings: No rash.  Neurological:     Mental Status: She is alert and oriented to person, place, and time.     Cranial Nerves: No cranial nerve deficit.     Deep Tendon Reflexes: Reflexes are normal and  symmetric.  Psychiatric:        Behavior: Behavior normal.        Thought Content: Thought content normal.        Judgment: Judgment normal.      BP (!) 145/69   Pulse 85   Temp 98 F (36.7 C)   Ht 5' 3 (1.6 m)   Wt 162 lb 6.4 oz (73.7 kg)   SpO2 98%   BMI 28.77 kg/m      Assessment & Plan:  SHAKORA NORDQUIST comes in today with chief complaint of 4 MONTH FOLLOW UP   Diagnosis and orders addressed:  1. Primary hypertension (Primary) - CMP14+EGFR - amLODipine  (NORVASC ) 5 MG tablet; Take 1 tablet (5 mg total) by mouth daily.  Dispense: 90 tablet; Refill: 0  2. Gastroesophageal reflux disease, unspecified whether esophagitis present - CMP14+EGFR - esomeprazole  (NEXIUM ) 40 MG capsule; Take 1 capsule (40 mg total) by mouth daily.  Dispense: 90 capsule; Refill: 1  3. Mixed hyperlipidemia - CMP14+EGFR  4. Osteoporosis, unspecified osteoporosis type, unspecified pathological fracture presence - CMP14+EGFR - alendronate  (FOSAMAX ) 70 MG tablet; TAKE 1 TABLET WEEKLY ON AN EMPTY STOMACH WITH FULL GLASS OF WATER   Dispense: 12 tablet; Refill: 0  5. Vitamin D   deficiency - CMP14+EGFR  6. Prediabetes - CMP14+EGFR - Bayer DCA Hb A1c Waived  7. Primary osteoarthritis involving multiple joints - CMP14+EGFR  8. Vaginal itching - CMP14+EGFR - WET PREP FOR TRICH, YEAST, CLUE  9. Vaginal candidiasis - CMP14+EGFR - fluconazole  (DIFLUCAN ) 150 MG tablet; 1 po q week x 4 weeks  Dispense: 4 tablet; Refill: 0  10. Arthritis - CMP14+EGFR - meloxicam  (MOBIC ) 7.5 MG tablet; Take 1 tablet (7.5 mg total) by mouth daily.  Dispense: 90 tablet; Refill: 0  11. Senile purpura (HCC) - CMP14+EGFR  Labs pending Continue all medications  Keep follow up with with dermatologists and podiatry  Health Maintenance reviewed Diet and exercise encouraged  Follow up plan: 4 months   Bari Learn, FNP

## 2024-06-24 NOTE — Patient Instructions (Signed)

## 2024-06-25 LAB — CMP14+EGFR
ALT: 18 IU/L (ref 0–32)
AST: 23 IU/L (ref 0–40)
Albumin: 4.5 g/dL (ref 3.7–4.7)
Alkaline Phosphatase: 84 IU/L (ref 48–129)
BUN/Creatinine Ratio: 25 (ref 12–28)
BUN: 26 mg/dL (ref 8–27)
Bilirubin Total: 0.2 mg/dL (ref 0.0–1.2)
CO2: 25 mmol/L (ref 20–29)
Calcium: 9.7 mg/dL (ref 8.7–10.3)
Chloride: 102 mmol/L (ref 96–106)
Creatinine, Ser: 1.06 mg/dL — ABNORMAL HIGH (ref 0.57–1.00)
Globulin, Total: 2.3 g/dL (ref 1.5–4.5)
Glucose: 90 mg/dL (ref 70–99)
Potassium: 4.3 mmol/L (ref 3.5–5.2)
Sodium: 142 mmol/L (ref 134–144)
Total Protein: 6.8 g/dL (ref 6.0–8.5)
eGFR: 52 mL/min/1.73 — ABNORMAL LOW (ref >59)

## 2024-06-30 DIAGNOSIS — L309 Dermatitis, unspecified: Secondary | ICD-10-CM | POA: Diagnosis not present

## 2024-08-23 DIAGNOSIS — M79671 Pain in right foot: Secondary | ICD-10-CM | POA: Diagnosis not present

## 2024-08-23 DIAGNOSIS — M19072 Primary osteoarthritis, left ankle and foot: Secondary | ICD-10-CM | POA: Diagnosis not present

## 2024-08-25 ENCOUNTER — Encounter: Payer: Self-pay | Admitting: Family

## 2024-08-25 ENCOUNTER — Ambulatory Visit (INDEPENDENT_AMBULATORY_CARE_PROVIDER_SITE_OTHER): Admitting: Family

## 2024-08-25 VITALS — BP 156/76 | HR 66 | Temp 98.0°F | Ht 63.0 in | Wt 162.2 lb

## 2024-08-25 DIAGNOSIS — B372 Candidiasis of skin and nail: Secondary | ICD-10-CM | POA: Diagnosis not present

## 2024-08-25 DIAGNOSIS — H60333 Swimmer's ear, bilateral: Secondary | ICD-10-CM

## 2024-08-25 MED ORDER — CIPROFLOXACIN-DEXAMETHASONE 0.3-0.1 % OT SUSP
4.0000 [drp] | Freq: Two times a day (BID) | OTIC | 0 refills | Status: AC
Start: 2024-08-25 — End: ?

## 2024-08-25 MED ORDER — FLUCONAZOLE 150 MG PO TABS: 150.0000 mg | ORAL_TABLET | ORAL | 0 refills | Status: DC

## 2024-08-25 NOTE — Patient Instructions (Signed)
 Otitis Externa  Otitis externa is an infection of the outer ear canal. The outer ear canal is the area between the outside of the ear and the eardrum. Otitis externa is sometimes called swimmer's ear. What are the causes? Common causes of this condition include: Swimming in dirty water. Moisture in the ear. An injury to the inside of the ear. An object stuck in the ear. A cut or scrape on the outside of the ear or in the ear canal. What increases the risk? You are more likely to develop this condition if you go swimming often. What are the signs or symptoms? The first symptom of this condition is often itching in the ear. Later symptoms of the condition include: Swelling of the ear. Redness in the ear. Ear pain. The pain may get worse when you pull on your ear. Pus coming from the ear. How is this diagnosed? This condition may be diagnosed by examining the ear and testing fluid from the ear for bacteria and funguses. How is this treated? This condition may be treated with: Antibiotic ear drops. These are often given for 10-14 days. Medicines to reduce itching and swelling. Follow these instructions at home: If you were prescribed antibiotic ear drops, use them as told by your health care provider. Do not stop using the antibiotic even if you start to feel better. Take over-the-counter and prescription medicines only as told by your health care provider. Avoid getting water in your ears as told by your health care provider. This may include avoiding swimming or water sports for a few days. Keep all follow-up visits. This is important. How is this prevented? Keep your ears dry. Use the corner of a towel to dry your ears after you swim or bathe. Avoid scratching or putting things in your ear. Doing these things can damage the ear canal or remove the protective wax that lines it, which makes it easier for bacteria and funguses to grow. Avoid swimming in lakes, polluted water, or swimming  pools that may not have enough chlorine. Contact a health care provider if: You have a fever. Your ear is still red, swollen, painful, or draining pus after 3 days. Your redness, swelling, or pain gets worse. You have a severe headache. Get help right away if: You have redness, swelling, and pain or tenderness in the area behind your ear. Summary Otitis externa is an infection of the outer ear canal. Common causes include swimming in dirty water, moisture in the ear, or a cut or scrape in the ear. Symptoms include pain, redness, and swelling of the ear canal. If you were prescribed antibiotic ear drops, use them as told by your health care provider. Do not stop using the antibiotic even if you start to feel better. This information is not intended to replace advice given to you by your health care provider. Make sure you discuss any questions you have with your health care provider. Document Revised: 12/05/2020 Document Reviewed: 12/05/2020 Elsevier Patient Education  2024 [employer].

## 2024-08-25 NOTE — Progress Notes (Signed)
 Subjective:    Patient ID: Frances Morrison, female    DOB: 02-20-1941, 83 y.o.   MRN: [id number]  Chief Complaint  Patient presents with   ears fluid     Breaking out itching x 2 weeks pressure    PT presents to the office today with bilateral ear itching and discharge that started two weeks ago. She has been putting vaseline on the outside to help with scaly dry skin that mildly helps.  Ear Drainage  There is pain in both ears. This is a new problem. The current episode started 1 to 4 weeks ago. The problem has been unchanged. There has been no fever. The pain is mild. Associated symptoms include ear discharge and hearing loss. Pertinent negatives include no coughing, diarrhea, headaches, rhinorrhea, sore throat or vomiting. She has tried ear drops and acetaminophen  for the symptoms. The treatment provided no relief.      Review of Systems  HENT:  Positive for ear discharge and hearing loss. Negative for rhinorrhea and sore throat.   Respiratory:  Negative for cough.   Gastrointestinal:  Negative for diarrhea and vomiting.  Neurological:  Negative for headaches.  All other systems reviewed and are negative.   Social History   Socioeconomic History   Marital status: Married    Spouse name: Not on file   Number of children: Not on file   Years of education: Not on file   Highest education level: Not on file  Occupational History   Not on file  Tobacco Use   Smoking status: Former    Current packs/day: 0.00    Average packs/day: 1 pack/day for 12.0 years (12.0 ttl pk-yrs)    Types: Cigarettes    Start date: 07/10/1998    Quit date: 07/10/2010    Years since quitting: 14.1   Smokeless tobacco: Never  Vaping Use   Vaping status: Never Used  Substance and Sexual Activity   Alcohol use: No   Drug use: No   Sexual activity: Not Currently  Other Topics Concern   Not on file  Social History Narrative   Not on file   Social Drivers of Health   Financial Resource Strain: Low  Risk  (01/07/2024)   Overall Financial Resource Strain (CARDIA)    Difficulty of Paying Living Expenses: Not hard at all  Food Insecurity: No Food Insecurity (01/07/2024)   Hunger Vital Sign    Worried About Running Out of Food in the Last Year: Never true    Ran Out of Food in the Last Year: Never true  Transportation Needs: No Transportation Needs (01/07/2024)   PRAPARE - Administrator, Civil Service (Medical): No    Lack of Transportation (Non-Medical): No  Physical Activity: Insufficiently Active (01/07/2024)   Exercise Vital Sign    Days of Exercise per Week: 3 days    Minutes of Exercise per Session: 20 min  Stress: Patient Unable To Answer (01/07/2024)   [employer] - Occupational Stress Questionnaire    Feeling of Stress : Patient unable to answer  Social Connections: Moderately Integrated (01/07/2024)   Social Connection and Isolation Panel    Frequency of Communication with Friends and Family: More than three times a week    Frequency of Social Gatherings with Friends and Family: More than three times a week    Attends Religious Services: More than 4 times per year    Active Member of Clubs or Organizations: No    Attends  Banker Meetings: Never    Marital Status: Married   Family History  Problem Relation Age of Onset   Cancer Mother    Cancer Father         Objective:   Physical Exam Vitals reviewed.  Constitutional:      General: She is not in acute distress.    Appearance: She is well-developed.  HENT:     Head: Normocephalic and atraumatic.     Right Ear: Tenderness present.     Ears:     Comments: Right external ear erythemas, swollen, scaly skin Eyes:     Pupils: Pupils are equal, round, and reactive to light.  Neck:     Thyroid: No thyromegaly.  Cardiovascular:     Rate and Rhythm: Normal rate and regular rhythm.     Heart sounds: Normal heart sounds. No murmur heard. Pulmonary:     Effort: Pulmonary  effort is normal. No respiratory distress.     Breath sounds: Normal breath sounds. No wheezing.  Abdominal:     General: Bowel sounds are normal. There is no distension.     Palpations: Abdomen is soft.     Tenderness: There is no abdominal tenderness.  Musculoskeletal:        General: No tenderness. Normal range of motion.     Cervical back: Normal range of motion and neck supple.  Skin:    General: Skin is warm and dry.  Neurological:     Mental Status: She is alert and oriented to person, place, and time.     Cranial Nerves: No cranial nerve deficit.     Deep Tendon Reflexes: Reflexes are normal and symmetric.  Psychiatric:        Behavior: Behavior normal.        Thought Content: Thought content normal.        Judgment: Judgment normal.       BP (!) 156/76   Pulse 66   Temp 98 F (36.7 C) (Temporal)   Ht 5' 3 (1.6 m)   Wt 162 lb 3.2 oz (73.6 kg)   BMI 28.73 kg/m      Assessment & Plan:  Frances Morrison comes in today with chief complaint of ears fluid  (Breaking out itching x 2 weeks pressure )   Diagnosis and orders addressed:  1. Acute swimmer's ear of both sides (Primary) - ciprofloxacin-dexamethasone  (CIPRODEX) OTIC suspension; Place 4 drops into both ears 2 (two) times daily.  Dispense: 7.5 mL; Refill: 0  2. Candidal skin infection - fluconazole  (DIFLUCAN ) 150 MG tablet; Take 1 tablet (150 mg total) by mouth every 3 (three) days.  Dispense: 3 tablet; Refill: 0   Start using ciprodex drops Start diflucan  Can use vagisil on outer ear Keep clean and dry  Report any worsening symptoms or fevers    Bari Learn, FNP

## 2024-08-30 ENCOUNTER — Ambulatory Visit (INDEPENDENT_AMBULATORY_CARE_PROVIDER_SITE_OTHER): Admitting: Nurse Practitioner

## 2024-08-30 ENCOUNTER — Encounter: Payer: Self-pay | Admitting: Nurse Practitioner

## 2024-08-30 ENCOUNTER — Ambulatory Visit: Payer: Self-pay

## 2024-08-30 VITALS — BP 142/70 | HR 67 | Temp 97.5°F | Ht 63.0 in | Wt 159.0 lb

## 2024-08-30 DIAGNOSIS — L309 Dermatitis, unspecified: Secondary | ICD-10-CM | POA: Diagnosis not present

## 2024-08-30 MED ORDER — CEPHALEXIN 500 MG PO CAPS: 500.0000 mg | ORAL_CAPSULE | Freq: Three times a day (TID) | ORAL | 0 refills | Status: DC

## 2024-08-30 MED ORDER — TRIAMCINOLONE ACETONIDE 0.1 % EX CREA: 1.0000 | TOPICAL_CREAM | Freq: Two times a day (BID) | CUTANEOUS | 0 refills | Status: AC

## 2024-08-30 MED ORDER — PREDNISONE 20 MG PO TABS: 40.0000 mg | ORAL_TABLET | Freq: Every day | ORAL | 0 refills | Status: AC

## 2024-08-30 NOTE — Telephone Encounter (Signed)
 APPT MADE

## 2024-08-30 NOTE — Telephone Encounter (Signed)
 FYI Only or Action Required?: FYI only for provider: appointment scheduled on 08/30/24.  Patient was last seen in primary care on 08/25/2024 by Frances Morrison LABOR, FNP.  Called Nurse Triage reporting Ear Drainage.  Symptoms began several weeks ago.  Interventions attempted: Prescription medications: ciprodex , diflucan .  Symptoms are: unchanged.  Triage Disposition: See Physician Within 24 Hours  Patient/caregiver understands and will follow disposition?: Yes Reason for Disposition  Ear pain  Answer Assessment - Initial Assessment Questions No improvement in ear drainage since appointment, is breaking out in rash around ear/on scalp. States pruritus has resolved. Drainage is clear, without odor. Denies head trauma prior to or since onset.   Patient also reports a few small red spots on her body, states it is bilateral.   1. LOCATION: Which ear is involved?      Bilateral, right is worse 2. COLOR: What is the color of the discharge?      Red 3. CONSISTENCY: How runny is the discharge? Could it be water ?      Constant 4. ONSET: When did you first notice the discharge?     4 weeks 5. PAIN: Is there any earache? How bad is it?  (Scale 0-10; none, mild, moderate or severe)     Stings  Protocols used: Ear - Discharge-A-AH Copied from CRM [id number]. Topic: Clinical - Red Word Triage >> Aug 30, 2024  8:13 AM Frances Morrison wrote: Red Word that prompted transfer to Nurse Triage: Ear swollen, draining, red, and breaking out around hairline

## 2024-08-30 NOTE — Progress Notes (Signed)
   Subjective:    Patient ID: Frances Morrison, female    DOB: 04-Sep-1941, 83 y.o.   MRN: [id number]   Chief Complaint: Right ear red and now spreading   HPI  Patient in c/o right ear pain and drainage. Was seen 1 week ago and was dx with yeast infection and was given diflucan . Has gotten worse since yesterday. Patient Active Problem List   Diagnosis Date Noted   Senile purpura 06/24/2024   Primary osteoarthritis involving multiple joints 02/22/2024   Prediabetes 11/06/2023   Hypertension    GERD (gastroesophageal reflux disease)    Osteoporosis    Vitamin D  deficiency    Dyshidrotic eczema 11/08/2018   History of total knee arthroplasty 07/25/2015   Edema of right lower extremity 03/30/2015   Mixed hyperlipidemia 09/13/2014       Review of Systems  Constitutional:  Negative for diaphoresis.  Eyes:  Negative for pain.  Respiratory:  Negative for shortness of breath.   Cardiovascular:  Negative for chest pain, palpitations and leg swelling.  Gastrointestinal:  Negative for abdominal pain.  Endocrine: Negative for polydipsia.  Skin:  Negative for rash.  Neurological:  Negative for dizziness, weakness and headaches.  Hematological:  Does not bruise/bleed easily.  All other systems reviewed and are negative.      Objective:   Physical Exam Constitutional:      Appearance: Normal appearance.  HENT:     Head:     Comments: Bil external ear erythematous and crustiness- see picture Cardiovascular:     Rate and Rhythm: Normal rate and regular rhythm.     Heart sounds: Normal heart sounds.  Pulmonary:     Breath sounds: Normal breath sounds.  Skin:    General: Skin is warm.  Neurological:     General: No focal deficit present.     Mental Status: She is alert and oriented to person, place, and time.  Psychiatric:        Mood and Affect: Mood normal.        Behavior: Behavior normal.    BP (!) 142/70   Pulse 67   Temp (!) 97.5 F (36.4 C) (Temporal)   Ht 5' 3 (1.6  m)   Wt 159 lb (72.1 kg)   SpO2 96%   BMI 28.17 kg/m        Assessment & Plan:   Frances Morrison in today with chief complaint of Right ear red and now spreading   1. Dermatitis of external ear (Primary) Ice as needed Avoid rubbing or scratching RTO if not improving - triamcinolone  cream (KENALOG ) 0.1 %; Apply 1 Application topically 2 (two) times daily.  Dispense: 30 g; Refill: 0 - cephALEXin  (KEFLEX ) 500 MG capsule; Take 1 capsule (500 mg total) by mouth 3 (three) times daily.  Dispense: 30 capsule; Refill: 0 - predniSONE  (DELTASONE ) 20 MG tablet; Take 2 tablets (40 mg total) by mouth daily with breakfast for 5 days. 2 po daily for 5 days  Dispense: 10 tablet; Refill: 0   Consulted with Dr. Jolinda  The above assessment and management plan was discussed with the patient. The patient verbalized understanding of and has agreed to the management plan. Patient is aware to call the clinic if symptoms persist or worsen. Patient is aware when to return to the clinic for a follow-up visit. Patient educated on when it is appropriate to go to the emergency department.   Mary-Margaret Gladis, FNP

## 2024-10-12 ENCOUNTER — Other Ambulatory Visit: Payer: Self-pay | Admitting: Family

## 2024-10-12 DIAGNOSIS — M1990 Unspecified osteoarthritis, unspecified site: Secondary | ICD-10-CM

## 2024-10-12 DIAGNOSIS — I10 Essential (primary) hypertension: Secondary | ICD-10-CM

## 2024-10-12 DIAGNOSIS — M810 Age-related osteoporosis without current pathological fracture: Secondary | ICD-10-CM

## 2024-10-24 ENCOUNTER — Encounter: Payer: Self-pay | Admitting: Family

## 2024-10-24 ENCOUNTER — Ambulatory Visit (INDEPENDENT_AMBULATORY_CARE_PROVIDER_SITE_OTHER): Payer: Self-pay | Admitting: Family

## 2024-10-24 VITALS — BP 149/71 | HR 74 | Temp 97.3°F | Ht 63.0 in | Wt 159.6 lb

## 2024-10-24 DIAGNOSIS — E782 Mixed hyperlipidemia: Secondary | ICD-10-CM

## 2024-10-24 DIAGNOSIS — E559 Vitamin D deficiency, unspecified: Secondary | ICD-10-CM

## 2024-10-24 DIAGNOSIS — K219 Gastro-esophageal reflux disease without esophagitis: Secondary | ICD-10-CM | POA: Diagnosis not present

## 2024-10-24 DIAGNOSIS — R7303 Prediabetes: Secondary | ICD-10-CM | POA: Diagnosis not present

## 2024-10-24 DIAGNOSIS — N904 Leukoplakia of vulva: Secondary | ICD-10-CM | POA: Diagnosis not present

## 2024-10-24 DIAGNOSIS — N898 Other specified noninflammatory disorders of vagina: Secondary | ICD-10-CM | POA: Diagnosis not present

## 2024-10-24 DIAGNOSIS — I10 Essential (primary) hypertension: Secondary | ICD-10-CM

## 2024-10-24 DIAGNOSIS — D692 Other nonthrombocytopenic purpura: Secondary | ICD-10-CM | POA: Diagnosis not present

## 2024-10-24 DIAGNOSIS — M810 Age-related osteoporosis without current pathological fracture: Secondary | ICD-10-CM | POA: Diagnosis not present

## 2024-10-24 DIAGNOSIS — M150 Primary generalized (osteo)arthritis: Secondary | ICD-10-CM

## 2024-10-24 LAB — URINALYSIS, COMPLETE
Nitrite, UA: NEGATIVE
Specific Gravity, UA: 1.025 (ref 1.005–1.030)
Urobilinogen, Ur: 1 mg/dL (ref 0.2–1.0)
pH, UA: 5.5 (ref 5.0–7.5)

## 2024-10-24 LAB — CBC WITH DIFFERENTIAL/PLATELET
Basophils Absolute: 0.1 x10E3/uL (ref 0.0–0.2)
Basos: 1 %
EOS (ABSOLUTE): 0.4 x10E3/uL (ref 0.0–0.4)
Eos: 5 %
Hematocrit: 40.5 % (ref 34.0–46.6)
Hemoglobin: 12.9 g/dL (ref 11.1–15.9)
Immature Grans (Abs): 0 x10E3/uL (ref 0.0–0.1)
Immature Granulocytes: 0 %
Lymphocytes Absolute: 1.2 x10E3/uL (ref 0.7–3.1)
Lymphs: 14 %
MCH: 29 pg (ref 26.6–33.0)
MCHC: 31.9 g/dL (ref 31.5–35.7)
MCV: 91 fL (ref 79–97)
Monocytes Absolute: 0.6 x10E3/uL (ref 0.1–0.9)
Monocytes: 7 %
Neutrophils Absolute: 6.1 x10E3/uL (ref 1.4–7.0)
Neutrophils: 73 %
Platelets: 212 x10E3/uL (ref 150–450)
RBC: 4.45 x10E6/uL (ref 3.77–5.28)
RDW: 13.5 % (ref 11.7–15.4)
WBC: 8.3 x10E3/uL (ref 3.4–10.8)

## 2024-10-24 LAB — CMP14+EGFR
ALT: 18 IU/L (ref 0–32)
AST: 28 IU/L (ref 0–40)
Albumin: 4.5 g/dL (ref 3.7–4.7)
Alkaline Phosphatase: 78 IU/L (ref 48–129)
BUN/Creatinine Ratio: 17 (ref 12–28)
BUN: 16 mg/dL (ref 8–27)
Bilirubin Total: 0.3 mg/dL (ref 0.0–1.2)
CO2: 23 mmol/L (ref 20–29)
Calcium: 9.6 mg/dL (ref 8.7–10.3)
Chloride: 103 mmol/L (ref 96–106)
Creatinine, Ser: 0.95 mg/dL (ref 0.57–1.00)
Globulin, Total: 2.8 g/dL (ref 1.5–4.5)
Glucose: 113 mg/dL — ABNORMAL HIGH (ref 70–99)
Potassium: 4.2 mmol/L (ref 3.5–5.2)
Sodium: 143 mmol/L (ref 134–144)
Total Protein: 7.3 g/dL (ref 6.0–8.5)
eGFR: 59 mL/min/1.73 — ABNORMAL LOW

## 2024-10-24 LAB — WET PREP FOR TRICH, YEAST, CLUE
Clue Cell Exam: NEGATIVE
Trichomonas Exam: NEGATIVE
Yeast Exam: NEGATIVE

## 2024-10-24 MED ORDER — CLOBETASOL PROPIONATE 0.05 % EX CREA: 1.0000 | TOPICAL_CREAM | Freq: Two times a day (BID) | CUTANEOUS | 2 refills | Status: AC

## 2024-10-24 NOTE — Patient Instructions (Signed)
 Itchy, Painful, White Skin Patches (Lichen Sclerosus): What to Know Lichen sclerosus is a skin problem. It can happen on any part of your body, but often affects the areas around your genitals or the opening of your butt (anus). Treatment can help to control symptoms. It can also help prevent scarring that may lead to other problems. Lichen sclerosus isn't an infection or a fungus. It can't be passed from person to person. What are the causes? The cause of this condition isn't known. It may be related to: The body's defense system, also called the immune system, reacting too strongly. A lack of certain hormones. What increases the risk? You're more likely to get this condition if: You're a female who has stopped having periods. This is called menopause. You're a female who wasn't circumcised. You're a child who hasn't reached puberty yet. What are the signs or symptoms?  Plaques. These are areas on the skin that are: White. Thin. Wrinkled. Thickened. Red and swollen patches on the skin. Tears or cracks in the skin. Bruising. Blood blisters. Very bad itching. Pain, itching, or burning when peeing. Trouble pooping (constipation), especially in children. How is this diagnosed? This condition may be diagnosed with a physical exam. Sometimes, a tissue sample is taken and checked under a microscope. This is called a biopsy. How is this treated? This condition may be treated with: Topical steroids. These are creams and ointments that are put on the skin. Medicines taken by mouth. Topical immunotherapy. These are creams and ointments that are put on the skin to stimulate the body's defense system. Surgery. This may need to be done if there are problems like scarring. Follow these instructions at home: Medicines Take or apply your medicines only as told. Use creams or ointments as told by your health care provider. Skin care Do not scratch the affected areas. Keep the affected areas  clean and dry. Clean the affected area gently with water  only. Pat your skin dry. Avoid using rough towels or toilet paper. Avoid skin products that irritate the skin. These include scented soaps, lotions, and bubble bath. Use thick cream or ointments to lessen itching as told. General instructions Your condition may cause trouble pooping. To help prevent or treat this, you may need to: Take medicines to help you poop. Eat foods high in fiber, like beans, whole grains, and fresh fruits and vegetables. Drink more fluids as told. Keep all follow-up visits to make sure the treatment plan is working. Contact a health care provider if: You have more redness, swelling, or pain in the affected area. You have fluid, blood, or pus coming from the affected area. You have new patches on your skin. You have a fever. You have pain during sex. This information is not intended to replace advice given to you by your health care provider. Make sure you discuss any questions you have with your health care provider. Document Revised: 04/28/2023 Document Reviewed: 04/28/2023 Elsevier Patient Education  2024 [employer].

## 2024-10-24 NOTE — Progress Notes (Signed)
 "  Subjective:    Patient ID: Frances Morrison, female    DOB: August 01, 1941, 84 y.o.   MRN: [id number]  Chief Complaint  Patient presents with   Medical Management of Chronic Issues   Rash    On both arms    Vaginal Itching    Still has the itching and burns when she urinates    Pt presents to the office today for chronic follow up.   She has osteoporosis and takes Fosamax  weekly. States she does not do any weight bearing exercises, except cleaning her home. She takes calcium  and vit d daily. Last Dexascan was 04/28/23.   She is followed by Dermatologists  for actinic keratosis.   She is seeing podiatry and diagnosed with hairline fracture of her left food. Doing better with this.   Her BP is elevated today. Hypertension This is a chronic problem. The current episode started more than 1 year ago. The problem has been waxing and waning since onset. The problem is uncontrolled. Pertinent negatives include no blurred vision, headaches, malaise/fatigue or peripheral edema. Risk factors for coronary artery disease include dyslipidemia, obesity, sedentary lifestyle and post-menopausal state. The current treatment provides moderate improvement.  Gastroesophageal Reflux She complains of belching and heartburn. This is a chronic problem. The current episode started more than 1 year ago. The problem occurs occasionally. The symptoms are aggravated by certain foods. Risk factors include obesity. She has tried a PPI for the symptoms. The treatment provided moderate relief.  Arthritis Presents for follow-up visit. She complains of pain and stiffness. Affected locations include the left MCP, right MCP, left foot, right foot and right elbow. Her pain is at a severity of 9/10. Associated symptoms include dysuria and rash.  Hyperlipidemia This is a chronic problem. The current episode started more than 1 year ago. The problem is controlled. Recent lipid tests were reviewed and are normal. Exacerbating diseases  include obesity. Current antihyperlipidemic treatment includes diet change. The current treatment provides mild improvement of lipids. Risk factors for coronary artery disease include dyslipidemia, hypertension, a sedentary lifestyle and post-menopausal.  Diabetes She presents for her follow-up diabetic visit. Diabetes type: prediabetes. Pertinent negatives for hypoglycemia include no headaches. Pertinent negatives for diabetes include no blurred vision and no foot paresthesias. Symptoms are stable. Risk factors for coronary artery disease include diabetes mellitus, dyslipidemia, hypertension and post-menopausal. She is following a generally healthy diet. (Does not check glucose at home)  Vaginal Itching The patient's primary symptoms include genital itching and vaginal discharge. The patient's pertinent negatives include no genital odor. This is a new problem. The current episode started yesterday. The problem occurs intermittently. Associated symptoms include dysuria and rash. Pertinent negatives include no back pain, chills, frequency or headaches. The vaginal discharge was yellow. She has tried nothing for the symptoms. The treatment provided no relief.  Rash This is a new problem. The current episode started 1 to 4 weeks ago. The problem is unchanged. The affected locations include the right arm and left arm. The rash is characterized by itchiness and redness. Past treatments include moisturizer. The treatment provided mild relief.      Review of Systems  Constitutional:  Negative for chills and malaise/fatigue.  Eyes:  Negative for blurred vision.  Gastrointestinal:  Positive for heartburn.  Genitourinary:  Positive for dysuria and vaginal discharge. Negative for frequency.  Musculoskeletal:  Positive for stiffness. Negative for back pain.  Skin:  Positive for rash.  Neurological:  Negative for headaches.  All other systems  reviewed and are negative.  Family History  Problem Relation Age  of Onset   Cancer Mother    Cancer Father     Social History   Socioeconomic History   Marital status: Married    Spouse name: Not on file   Number of children: Not on file   Years of education: Not on file   Highest education level: Not on file  Occupational History   Not on file  Tobacco Use   Smoking status: Former    Current packs/day: 0.00    Average packs/day: 1 pack/day for 12.0 years (12.0 ttl pk-yrs)    Types: Cigarettes    Start date: 07/10/1998    Quit date: 07/10/2010    Years since quitting: 14.3   Smokeless tobacco: Never  Vaping Use   Vaping status: Never Used  Substance and Sexual Activity   Alcohol use: No   Drug use: No   Sexual activity: Not Currently  Other Topics Concern   Not on file  Social History Narrative   Not on file   Social Drivers of Health   Tobacco Use: Medium Risk (10/24/2024)   Patient History    Smoking Tobacco Use: Former    Smokeless Tobacco Use: Never    Passive Exposure: Not on Actuary Strain: Low Risk (01/07/2024)   Overall Financial Resource Strain (CARDIA)    Difficulty of Paying Living Expenses: Not hard at all  Food Insecurity: No Food Insecurity (01/07/2024)   Hunger Vital Sign    Worried About Running Out of Food in the Last Year: Never true    Ran Out of Food in the Last Year: Never true  Transportation Needs: No Transportation Needs (01/07/2024)   PRAPARE - Administrator, Civil Service (Medical): No    Lack of Transportation (Non-Medical): No  Physical Activity: Insufficiently Active (01/07/2024)   Exercise Vital Sign    Days of Exercise per Week: 3 days    Minutes of Exercise per Session: 20 min  Stress: Patient Unable To Answer (01/07/2024)   [employer] - Occupational Stress Questionnaire    Feeling of Stress : Patient unable to answer  Social Connections: Moderately Integrated (01/07/2024)   Social Connection and Isolation Panel    Frequency of Communication  with Friends and Family: More than three times a week    Frequency of Social Gatherings with Friends and Family: More than three times a week    Attends Religious Services: More than 4 times per year    Active Member of Clubs or Organizations: No    Attends Banker Meetings: Never    Marital Status: Married  Depression (PHQ2-9): Low Risk (10/24/2024)   Depression (PHQ2-9)    PHQ-2 Score: 0  Alcohol Screen: Low Risk (01/07/2024)   Alcohol Screen    Last Alcohol Screening Score (AUDIT): 0  Housing: Low Risk (01/05/2023)   Housing    Last Housing Risk Score: 0  Utilities: Not At Risk (01/05/2023)   AHC Utilities    Threatened with loss of utilities: No  Health Literacy: Adequate Health Literacy (01/07/2024)   B1300 Health Literacy    Frequency of need for help with medical instructions: Never       Objective:   Physical Exam Vitals reviewed.  Constitutional:      General: She is not in acute distress.    Appearance: She is well-developed. She is obese.  HENT:     Head: Normocephalic and atraumatic.  Right Ear: Tympanic membrane normal.     Left Ear: Tympanic membrane normal.  Eyes:     Pupils: Pupils are equal, round, and reactive to light.  Neck:     Thyroid: No thyromegaly.  Cardiovascular:     Rate and Rhythm: Normal rate and regular rhythm.     Heart sounds: Normal heart sounds. No murmur heard. Pulmonary:     Effort: Pulmonary effort is normal. No respiratory distress.     Breath sounds: Normal breath sounds. No wheezing.  Abdominal:     General: Bowel sounds are normal. There is no distension.     Palpations: Abdomen is soft.     Tenderness: There is no abdominal tenderness.  Genitourinary:    Labia:        Right: Rash and tenderness present.        Left: Rash and tenderness present.      Comments: labia majora and minora fused together and erythemas  Musculoskeletal:        General: No tenderness. Normal range of motion.     Cervical back: Normal  range of motion and neck supple.  Feet:     Comments: Swollen area, no erythemas, heat or pain noted Skin:    General: Skin is warm and dry.     Findings: No rash.  Neurological:     Mental Status: She is alert and oriented to person, place, and time.     Cranial Nerves: No cranial nerve deficit.     Deep Tendon Reflexes: Reflexes are normal and symmetric.  Psychiatric:        Behavior: Behavior normal.        Thought Content: Thought content normal.        Judgment: Judgment normal.      BP (!) 149/71   Pulse 74   Temp (!) 97.3 F (36.3 C) (Temporal)   Ht 5' 3 (1.6 m)   Wt 159 lb 9.6 oz (72.4 kg)   SpO2 97%   BMI 28.27 kg/m      Assessment & Plan:  Frances Morrison comes in today with chief complaint of Medical Management of Chronic Issues, Rash (On both arms ), and Vaginal Itching (Still has the itching and burns when she urinates )   Diagnosis and orders addressed:  1. Vaginal itching Negative wet prep - Urinalysis, Complete - WET PREP FOR TRICH, YEAST, CLUE - Urine Culture - CBC with Differential/Platelet - CMP14+EGFR  2. Gastroesophageal reflux disease, unspecified whether esophagitis present - CBC with Differential/Platelet - CMP14+EGFR  3. Osteoporosis, unspecified osteoporosis type, unspecified pathological fracture presence - CBC with Differential/Platelet - CMP14+EGFR  4. Mixed hyperlipidemia - CBC with Differential/Platelet - CMP14+EGFR  5. Primary hypertension (Primary) - CBC with Differential/Platelet - CMP14+EGFR  6. Prediabetes - CBC with Differential/Platelet - CMP14+EGFR  7. Primary osteoarthritis involving multiple joints - CBC with Differential/Platelet - CMP14+EGFR  8. Vitamin D  deficiency - CBC with Differential/Platelet - CMP14+EGFR  9. Senile purpura - CBC with Differential/Platelet - CMP14+EGFR  10. Lichen sclerosus of vulva Discussed this is a chronic illness, she will need to use the clobetasol  cream a couple days a  week for the rest of her life.  Avoid scratching Keep clean and dry - clobetasol  cream (TEMOVATE ) 0.05 %; Apply 1 Application topically 2 (two) times daily.  Dispense: 60 g; Refill: 2 - CBC with Differential/Platelet - CMP14+EGFR   Labs pending Continue all medications  Keep follow up with with dermatologists and podiatry  Health Maintenance  reviewed Diet and exercise encouraged  Follow up plan: 3 months   Bari Learn, FNP  "

## 2024-10-25 ENCOUNTER — Ambulatory Visit: Payer: Self-pay

## 2024-10-25 ENCOUNTER — Ambulatory Visit: Payer: Self-pay | Admitting: Family

## 2024-10-25 NOTE — Telephone Encounter (Signed)
 FYI Only or Action Required?: FYI only for provider: lab results relayed.  Patient was last seen in primary care on 10/24/2024 by Lavell Bari LABOR, FNP.  Called Nurse Triage reporting No chief complaint on file..  Symptoms began several days ago.  Interventions attempted: Prescription medications: clobestol.  Symptoms are: unchanged.  Triage Disposition: Call PCP When Office is Open  Patient/caregiver understands and will follow disposition?: Yes  Copied from CRM #[id number]. Topic: Clinical - Lab/Test Results >> Oct 25, 2024  3:48 PM Antwanette L wrote: Reason for CRM: pt is returning a missed call from [place] regarding urinalysis. Called [employer] but the rep was having issues with her computer Reason for Disposition  Caller requesting routine or non-urgent lab result  Answer Assessment - Initial Assessment Questions 1. REASON FOR CALL or QUESTION: What is your reason for calling today? or How can I best     Lab results relayed 2. CALLER: Document the source of call. (e.g., laboratory staff, caregiver or patient).     Patient given lab results:  Kidney and liver function stable CBC stable  Wet prep negative  Urine negative   Will continue to use the clobestol cream to the affected area. No further questions or concerns  Protocols used: PCP Call - No Triage-A-AH

## 2024-10-26 LAB — URINE CULTURE

## 2024-10-27 ENCOUNTER — Ambulatory Visit: Payer: Self-pay

## 2024-10-27 NOTE — Telephone Encounter (Signed)
 FYI Only or Action Required?: FYI only for provider: appointment scheduled on 10/28/24.  Patient was last seen in primary care on 10/24/2024 by Lavell Bari LABOR, FNP.  Called Nurse Triage reporting [place].  Symptoms began yesterday.  Interventions attempted: Nothing.  Symptoms are: stable.  Triage Disposition: See PCP When Office is Open (Within 3 Days)  Patient/caregiver understands and will follow disposition?: Yes   Reason for Disposition  [1] Small swelling or lump AND [2] unexplained AND [3] present > 1 week  Answer Assessment - Initial Assessment Questions Pt was seen 10/24/24 and was dx with Lichen sclerosus of vulva and given clobetasol  cream (TEMOVATE ) 0.05 %; Apply 1 Application topically 2 (two) times daily, never used it before Monday.   1. APPEARANCE of SWELLING: What does it look like?     Unable to assess  2. SIZE: How large is the swelling? (e.g., inches, cm; or compare to size of pinhead, tip of pen, eraser, coin, pea, grape, ping pong ball)      About pea sized on the vagina, unsure of size on backside  3. LOCATION: Where is the swelling located?     Left side of buttocks, 2 bumps on each side of vagina  4. ONSET: When did the swelling start?     Yesterday  6. PAIN: Is there any pain? If Yes, ask: How bad is the pain? (Scale 1-10; or mild, moderate, severe)       Painful to sit, mild  7. ITCH: Does it itch? If Yes, ask: How bad is the itch?      Denies  8. CAUSE: What do you think caused the swelling?     Unsure  9 OTHER SYMPTOMS: Do you have any other symptoms? (e.g., fever)     Denies  Protocols used: Skin Lump or Localized Swelling-A-AH  Message from Mercer PEDLAR sent at 10/27/2024  3:06 PM EST  Reason for Triage: Painful bump on buttocks.

## 2024-10-28 ENCOUNTER — Encounter: Payer: Self-pay | Admitting: Family

## 2024-10-28 ENCOUNTER — Ambulatory Visit (INDEPENDENT_AMBULATORY_CARE_PROVIDER_SITE_OTHER): Admitting: Family

## 2024-10-28 VITALS — BP 148/58 | HR 76 | Temp 97.6°F | Ht 63.0 in | Wt 160.6 lb

## 2024-10-28 DIAGNOSIS — L03317 Cellulitis of buttock: Secondary | ICD-10-CM

## 2024-10-28 MED ORDER — CEFTRIAXONE SODIUM 1 G IJ SOLR
1.0000 g | Freq: Once | INTRAMUSCULAR | Status: AC
  Administered 2024-10-28: 1 g via INTRAMUSCULAR

## 2024-10-28 MED ORDER — DOXYCYCLINE MONOHYDRATE 100 MG PO TABS: 100.0000 mg | ORAL_TABLET | Freq: Two times a day (BID) | ORAL | 0 refills | Status: AC

## 2024-10-28 NOTE — Progress Notes (Signed)
 "  Subjective:    Patient ID: Frances Morrison, female    DOB: 1941-02-17, 84 y.o.   MRN: [id number]  Chief Complaint  Patient presents with   Sore    On bottoms    HPI PT presents to the office today with a rash on left buttocks  and vaginal that she noticed 10/25/24. She was given clobetasol  cream to start on Monday for lichen sclerosus.   Reports mild aching pain 3 out of 10 in left buttocks when she sits. Reports mild warmth.    Review of Systems  All other systems reviewed and are negative.   Social History   Socioeconomic History   Marital status: Married    Spouse name: Not on file   Number of children: Not on file   Years of education: Not on file   Highest education level: Not on file  Occupational History   Not on file  Tobacco Use   Smoking status: Former    Current packs/day: 0.00    Average packs/day: 1 pack/day for 12.0 years (12.0 ttl pk-yrs)    Types: Cigarettes    Start date: 07/10/1998    Quit date: 07/10/2010    Years since quitting: 14.3   Smokeless tobacco: Never  Vaping Use   Vaping status: Never Used  Substance and Sexual Activity   Alcohol use: No   Drug use: No   Sexual activity: Not Currently  Other Topics Concern   Not on file  Social History Narrative   Not on file   Social Drivers of Health   Tobacco Use: Medium Risk (10/28/2024)   Patient History    Smoking Tobacco Use: Former    Smokeless Tobacco Use: Never    Passive Exposure: Not on file  Financial Resource Strain: Low Risk (01/07/2024)   Overall Financial Resource Strain (CARDIA)    Difficulty of Paying Living Expenses: Not hard at all  Food Insecurity: No Food Insecurity (01/07/2024)   Hunger Vital Sign    Worried About Running Out of Food in the Last Year: Never true    Ran Out of Food in the Last Year: Never true  Transportation Needs: No Transportation Needs (01/07/2024)   PRAPARE - Administrator, Civil Service (Medical): No    Lack of Transportation (Non-Medical):  No  Physical Activity: Insufficiently Active (01/07/2024)   Exercise Vital Sign    Days of Exercise per Week: 3 days    Minutes of Exercise per Session: 20 min  Stress: Patient Unable To Answer (01/07/2024)   [employer] - Occupational Stress Questionnaire    Feeling of Stress : Patient unable to answer  Social Connections: Moderately Integrated (01/07/2024)   Social Connection and Isolation Panel    Frequency of Communication with Friends and Family: More than three times a week    Frequency of Social Gatherings with Friends and Family: More than three times a week    Attends Religious Services: More than 4 times per year    Active Member of Clubs or Organizations: No    Attends Banker Meetings: Never    Marital Status: Married  Depression (PHQ2-9): Low Risk (10/28/2024)   Depression (PHQ2-9)    PHQ-2 Score: 0  Alcohol Screen: Low Risk (01/07/2024)   Alcohol Screen    Last Alcohol Screening Score (AUDIT): 0  Housing: Low Risk (01/05/2023)   Housing    Last Housing Risk Score: 0  Utilities: Not At Risk (01/05/2023)   AHC  Utilities    Threatened with loss of utilities: No  Health Literacy: Adequate Health Literacy (01/07/2024)   B1300 Health Literacy    Frequency of need for help with medical instructions: Never   Family History  Problem Relation Age of Onset   Cancer Mother    Cancer Father         Objective:   Physical Exam Vitals reviewed.  Constitutional:      General: She is not in acute distress.    Appearance: She is well-developed.  HENT:     Head: Normocephalic and atraumatic.  Eyes:     Pupils: Pupils are equal, round, and reactive to light.  Neck:     Thyroid: No thyromegaly.  Cardiovascular:     Rate and Rhythm: Normal rate and regular rhythm.     Heart sounds: Normal heart sounds. No murmur heard. Pulmonary:     Effort: Pulmonary effort is normal. No respiratory distress.     Breath sounds: Normal breath sounds. No  wheezing.  Abdominal:     General: Bowel sounds are normal. There is no distension.     Palpations: Abdomen is soft.     Tenderness: There is no abdominal tenderness.  Musculoskeletal:        General: No tenderness. Normal range of motion.     Cervical back: Normal range of motion and neck supple.  Skin:    General: Skin is warm and dry.         Comments: Hard abscess on pubic area on bilateral side, right side draining purulent discharge, hard and tender  Neurological:     Mental Status: She is alert and oriented to person, place, and time.     Cranial Nerves: No cranial nerve deficit.     Deep Tendon Reflexes: Reflexes are normal and symmetric.  Psychiatric:        Behavior: Behavior normal.        Thought Content: Thought content normal.        Judgment: Judgment normal.       BP (!) 148/58   Pulse 76   Temp 97.6 F (36.4 C) (Temporal)   Ht 5' 3 (1.6 m)   Wt 160 lb 9.6 oz (72.8 kg)   SpO2 98%   BMI 28.45 kg/m      Assessment & Plan:  Frances Morrison comes in today with chief complaint of Sore (On bottoms )   Diagnosis and orders addressed:  1. Cellulitis of buttock (Primary) Rocephin  given  Start doxycycline   Warm baths TID  Use warm hot wash clothes  Follow up on Tuesday. More than likely closed on Monday related to weather. If not popped, will I&D Tuesday.  Go to ED with any fevers, increased pain - cefTRIAXone  (ROCEPHIN ) injection 1 g - doxycycline  (ADOXA) 100 MG tablet; Take 1 tablet (100 mg total) by mouth 2 (two) times daily.  Dispense: 20 tablet; Refill: 0   Bari Learn, FNP   "

## 2024-10-28 NOTE — Patient Instructions (Signed)

## 2024-11-01 ENCOUNTER — Encounter: Payer: Self-pay | Admitting: Family Medicine

## 2024-11-01 ENCOUNTER — Ambulatory Visit (INDEPENDENT_AMBULATORY_CARE_PROVIDER_SITE_OTHER): Admitting: Family Medicine

## 2024-11-01 ENCOUNTER — Ambulatory Visit

## 2024-11-01 VITALS — BP 157/68 | HR 81 | Temp 98.2°F | Ht 63.0 in | Wt 162.0 lb

## 2024-11-01 DIAGNOSIS — L03317 Cellulitis of buttock: Secondary | ICD-10-CM | POA: Diagnosis not present

## 2024-11-01 MED ORDER — AMOXICILLIN 875 MG PO TABS: 875.0000 mg | ORAL_TABLET | Freq: Two times a day (BID) | ORAL | 0 refills | Status: AC

## 2024-11-01 NOTE — Progress Notes (Signed)
 "  Acute Office Visit  Patient ID: Frances Morrison, female    DOB: [birth year], 84 y.o.   MRN: [id number]  PCP: Lavell Bari LABOR, FNP  Chief Complaint  Patient presents with   abscess follow up    Subjective:     HPI  Discussed the use of AI scribe software for clinical note transcription with the patient, who gave verbal consent to proceed.  History of Present Illness   Frances Morrison is an 84 year old female who presents with cellulitis of the left buttock. She is accompanied by her daughter.  Left buttock cellulitis - Previously evaluated on October 28, 2024, for cellulitis of the left buttock - Received a Rocephin  injection and started on doxycycline  twice daily - Significant improvement in the area with reduction in size and only minimal drainage per the patient and family.  - Initially unable to sit on the affected side, now able to do so with improvement - Utilizing warm water  soaks and Epsom salt baths to aid in drainage  Medication compliance and tolerance - Currently taking doxycycline  twice daily - Compliant with medication regimen - No adverse effects from current medications       ROS     Objective:    BP (!) 157/68   Pulse 81   Temp 98.2 F (36.8 C)   Ht 5' 3 (1.6 m)   Wt 162 lb (73.5 kg)   SpO2 95%   BMI 28.70 kg/m    Physical Exam Vitals reviewed.  Constitutional:      Appearance: Normal appearance.  HENT:     Head: Normocephalic and atraumatic.  Eyes:     Extraocular Movements: Extraocular movements intact.     Conjunctiva/sclera: Conjunctivae normal.     Pupils: Pupils are equal, round, and reactive to light.  Cardiovascular:     Rate and Rhythm: Normal rate and regular rhythm.     Pulses: Normal pulses.     Heart sounds: Normal heart sounds. No murmur heard. Pulmonary:     Effort: Pulmonary effort is normal. No respiratory distress.     Breath sounds: Normal breath sounds.  Musculoskeletal:        General: No deformity. Normal range of  motion.     Cervical back: Normal range of motion.  Skin:    General: Skin is warm and dry.     Comments: When compared to the photo from the previous visit the area of cellulitis/abscess to the left buttocks appears much improved. Has become smaller and less erythematous.  No area of fluctuance.  No drainage.  Neurological:     General: No focal deficit present.     Mental Status: She is alert and oriented to person, place, and time.  Psychiatric:        Mood and Affect: Mood normal.        Behavior: Behavior normal.       No results found for any visits on 11/01/24.     Assessment & Plan:   Problem List Items Addressed This Visit   None Visit Diagnoses       Cellulitis of buttock    -  Primary   Relevant Medications   amoxicillin  (AMOXIL ) 875 MG tablet       Assessment and Plan    Cellulitis with Abscess - Add amoxicillin  875 mg twice daily for 7 days to broaden coverage. - Continue doxycycline  twice daily as previously ordered - Continue warm sitz baths TID.  - Schedule follow-up  appointment in 1 week for reevaluation. - Follow up sooner if symptoms acutely worsen, fever develops, or for any other concerns        Meds ordered this encounter  Medications   amoxicillin  (AMOXIL ) 875 MG tablet    Sig: Take 1 tablet (875 mg total) by mouth 2 (two) times daily for 7 days.    Dispense:  14 tablet    Refill:  0    Supervising Provider:   MARIBETH DARCEY KIDD [[id number]]    Return in about 1 week (around 11/08/2024).  Oneil LELON Severin, FNP [facility]   "

## 2024-11-08 ENCOUNTER — Ambulatory Visit: Admitting: Family

## 2024-11-08 ENCOUNTER — Encounter: Payer: Self-pay | Admitting: *Deleted

## 2024-11-08 NOTE — Progress Notes (Signed)
 Frances Morrison                                          MRN: [id number]   11/08/2024   The [facility] Quality Team Specialist reviewed this patient medical record for the purposes of chart review for care gap closure. The following were reviewed: chart review for care gap closure-controlling blood pressure.    [facility] Quality Team

## 2024-11-11 ENCOUNTER — Encounter: Payer: Self-pay | Admitting: Nurse Practitioner

## 2024-11-11 ENCOUNTER — Ambulatory Visit: Admitting: Nurse Practitioner

## 2024-11-11 VITALS — BP 146/60 | HR 71 | Temp 97.9°F | Ht 63.0 in | Wt 161.0 lb

## 2024-11-11 DIAGNOSIS — L03317 Cellulitis of buttock: Secondary | ICD-10-CM

## 2024-11-11 NOTE — Progress Notes (Signed)
" ° °  Subjective:    Patient ID: Frances Morrison, female    DOB: November 06, 1940, 84 y.o.   MRN: [id number]   Chief Complaint: Abscess follow up   HPI Patient was seen by M.Alcus, FNP on 11/01/24. She was dx with cellulitis of left buttocks. She had been seen in office on 10/28/24 with the same complaints and was was given rocephin  sot. When she returned for follow  up the area had not resolved. She was on doxycycline  at time of follow up. Amoxicillin  was added to doxycycline . Now not completely resolved. Is much better but has completed antibiotics and still has hard area that is tender to touch. No drainage. Patient Active Problem List   Diagnosis Date Noted   Lichen sclerosus of vulva 10/24/2024   Senile purpura 06/24/2024   Primary osteoarthritis involving multiple joints 02/22/2024   Prediabetes 11/06/2023   Hypertension    GERD (gastroesophageal reflux disease)    Osteoporosis    Vitamin D  deficiency    Dyshidrotic eczema 11/08/2018   History of total knee arthroplasty 07/25/2015   Edema of right lower extremity 03/30/2015   Mixed hyperlipidemia 09/13/2014       Review of Systems  Constitutional:  Negative for diaphoresis.  Eyes:  Negative for pain.  Respiratory:  Negative for shortness of breath.   Cardiovascular:  Negative for chest pain, palpitations and leg swelling.  Gastrointestinal:  Negative for abdominal pain.  Endocrine: Negative for polydipsia.  Skin:  Negative for rash.  Neurological:  Negative for dizziness, weakness and headaches.  Hematological:  Does not bruise/bleed easily.  All other systems reviewed and are negative.      Objective:   Physical Exam Constitutional:      Appearance: Normal appearance.  Cardiovascular:     Rate and Rhythm: Normal rate and regular rhythm.     Heart sounds: Normal heart sounds.  Pulmonary:     Breath sounds: Normal breath sounds.  Skin:    Comments: 4cm indurated mildly erythematous area on left buttoks - no drainage- has  improved but has not resolved with 3 antibiotics.  Neurological:     General: No focal deficit present.     Mental Status: She is alert and oriented to person, place, and time.  Psychiatric:        Mood and Affect: Mood normal.        Behavior: Behavior normal.    BP (!) 146/60   Pulse 71   Temp 97.9 F (36.6 C)   Ht 5' 3 (1.6 m)   Wt 161 lb (73 kg)   SpO2 98%   BMI 28.52 kg/m         Assessment & Plan:   Frances Morrison in today with chief complaint of Abscess follow up   1. Cellulitis of buttock (Primary) Continue to do sitz baths Do ot pick or scratch at area - Ambulatory referral to General Surgery    The above assessment and management plan was discussed with the patient. The patient verbalized understanding of and has agreed to the management plan. Patient is aware to call the clinic if symptoms persist or worsen. Patient is aware when to return to the clinic for a follow-up visit. Patient educated on when it is appropriate to go to the emergency department.   Mary-Margaret Gladis, FNP   "

## 2025-01-23 ENCOUNTER — Ambulatory Visit: Admitting: Family
